# Patient Record
Sex: Female | Born: 1935 | Race: Black or African American | Hispanic: No | Smoking: Never smoker
Health system: Southern US, Community
[De-identification: ages and names within clinical notes are randomized; demographics above are authoritative.]

## PROBLEM LIST (undated history)

## (undated) DIAGNOSIS — I10 Essential (primary) hypertension: Secondary | ICD-10-CM

## (undated) DIAGNOSIS — F32A Depression, unspecified: Secondary | ICD-10-CM

## (undated) DIAGNOSIS — M1990 Unspecified osteoarthritis, unspecified site: Secondary | ICD-10-CM

## (undated) DIAGNOSIS — F329 Major depressive disorder, single episode, unspecified: Secondary | ICD-10-CM

## (undated) DIAGNOSIS — K5900 Constipation, unspecified: Secondary | ICD-10-CM

## (undated) DIAGNOSIS — C140 Malignant neoplasm of pharynx, unspecified: Secondary | ICD-10-CM

## (undated) HISTORY — PX: FRACTURE SURGERY: SHX138

## (undated) HISTORY — PX: CATARACT EXTRACTION W/ INTRAOCULAR LENS  IMPLANT, BILATERAL: SHX1307

---

## 1959-07-05 HISTORY — PX: TONSILLECTOMY: SUR1361

## 1959-07-05 HISTORY — PX: THROAT SURGERY: SHX803

## 2003-02-02 ENCOUNTER — Ambulatory Visit (HOSPITAL_COMMUNITY): Admission: RE | Admit: 2003-02-02 | Discharge: 2003-02-02 | Payer: Self-pay | Admitting: Gastroenterology

## 2003-02-03 ENCOUNTER — Encounter: Payer: Self-pay | Admitting: Gastroenterology

## 2003-02-03 ENCOUNTER — Ambulatory Visit (HOSPITAL_COMMUNITY): Admission: RE | Admit: 2003-02-03 | Discharge: 2003-02-03 | Payer: Self-pay | Admitting: Gastroenterology

## 2003-10-15 ENCOUNTER — Emergency Department (HOSPITAL_COMMUNITY): Admission: EM | Admit: 2003-10-15 | Discharge: 2003-10-15 | Payer: Self-pay | Admitting: *Deleted

## 2005-10-08 ENCOUNTER — Encounter: Admission: RE | Admit: 2005-10-08 | Discharge: 2005-10-08 | Payer: Self-pay | Admitting: Family Medicine

## 2005-10-14 ENCOUNTER — Encounter: Admission: RE | Admit: 2005-10-14 | Discharge: 2005-10-14 | Payer: Self-pay | Admitting: Family Medicine

## 2007-01-05 ENCOUNTER — Encounter: Admission: RE | Admit: 2007-01-05 | Discharge: 2007-01-05 | Payer: Self-pay | Admitting: Family Medicine

## 2007-02-02 ENCOUNTER — Encounter: Admission: RE | Admit: 2007-02-02 | Discharge: 2007-02-02 | Payer: Self-pay | Admitting: Family Medicine

## 2008-06-09 ENCOUNTER — Encounter: Admission: RE | Admit: 2008-06-09 | Discharge: 2008-06-09 | Payer: Self-pay | Admitting: Family Medicine

## 2010-11-24 ENCOUNTER — Encounter: Payer: Self-pay | Admitting: Family Medicine

## 2010-11-25 ENCOUNTER — Emergency Department (HOSPITAL_COMMUNITY)
Admission: EM | Admit: 2010-11-25 | Discharge: 2010-11-25 | Payer: Self-pay | Source: Home / Self Care | Admitting: Emergency Medicine

## 2010-11-26 LAB — BASIC METABOLIC PANEL
BUN: 15 mg/dL (ref 6–23)
CO2: 27 mEq/L (ref 19–32)
Calcium: 10.7 mg/dL — ABNORMAL HIGH (ref 8.4–10.5)
Chloride: 103 mEq/L (ref 96–112)
Creatinine, Ser: 0.85 mg/dL (ref 0.4–1.2)
GFR calc Af Amer: >60 mL/min (ref >60)
GFR calc non Af Amer: >60 mL/min (ref >60)
Glucose, Bld: 92 mg/dL (ref 70–99)
Potassium: 4 mEq/L (ref 3.5–5.1)
Sodium: 140 mEq/L (ref 135–145)

## 2010-11-26 LAB — DIFFERENTIAL
Basophils Absolute: 0 10*3/uL (ref 0.0–0.1)
Basophils Relative: 0 % (ref 0–1)
Eosinophils Absolute: 0.1 10*3/uL (ref 0.0–0.7)
Eosinophils Relative: 1 % (ref 0–5)
Lymphocytes Relative: 16 % (ref 12–46)
Lymphs Abs: 1.2 10*3/uL (ref 0.7–4.0)
Monocytes Absolute: 1.4 10*3/uL — ABNORMAL HIGH (ref 0.1–1.0)
Monocytes Relative: 19 % — ABNORMAL HIGH (ref 3–12)
Neutro Abs: 4.8 10*3/uL (ref 1.7–7.7)
Neutrophils Relative %: 64 % (ref 43–77)

## 2010-11-26 LAB — CBC
HCT: 32.1 % — ABNORMAL LOW (ref 36.0–46.0)
Hemoglobin: 10.2 g/dL — ABNORMAL LOW (ref 12.0–15.0)
MCH: 26.6 pg (ref 26.0–34.0)
MCHC: 31.8 g/dL (ref 30.0–36.0)
MCV: 83.6 fL (ref 78.0–100.0)
Platelets: 277 10*3/uL (ref 150–400)
RBC: 3.84 MIL/uL — ABNORMAL LOW (ref 3.87–5.11)
RDW: 13.9 % (ref 11.5–15.5)
WBC: 7.4 10*3/uL (ref 4.0–10.5)

## 2011-03-21 NOTE — Op Note (Signed)
   NAMEHARRIS, Jacqueline Huber NO.:  [id number]   MEDICAL RECORD NO.:  [id number]                   PATIENT TYPE:  OUT   LOCATION:  XRAY                                 FACILITY:  [facility]   PHYSICIAN:  Danise Edge, M.D.                DATE OF BIRTH:  May 15, 1921   DATE OF PROCEDURE:  DATE OF DISCHARGE:                                 OPERATIVE REPORT   PROCEDURE PERFORMED:  Incomplete colonoscopy.   REFERRING PHYSICIAN:  Donia Guiles, M.D.   ENDOSCOPIST:  Charolett Bumpers, M.D.   INDICATIONS FOR PROCEDURE:  The patient is a 77 year old female born May 25, 1921.  The patient was scheduled to undergo a complete colonoscopy to  evaluate blood in her stool.  Due to left colonic loop formation, I was only  able to advance the colonoscope to approximately the splenic flexure.   PREMEDICATION:  Demerol 50l mg, Versed 8 mg.   INSTRUMENT USED:  Pediatric Olympus colonoscope.   DESCRIPTION OF PROCEDURE:  After obtaining informed consent, the patient was  placed in the left lateral decubitus position.  I administered intravenous  Demerol and intravenous Versed to achieve conscious sedation for the  procedure.  The patient's blood pressure, oxygen saturations and cardiac  rhythm were monitored throughout the procedure and documented in the medical  record.   Anal inspection was normal.  Digital rectal exam was normal.  The pediatric  Olympus video colonoscope was introduced into the rectum and advanced to the  splenic flexure noted at approximately 60 cm from the anal verge.  Due to  colonic loop formation, a complete colonoscopy was not possible.   Endoscopic appearance of her rectum, sigmoid colon, descending colon and  splenic flexure was normal.  There was no sign of gastrointestinal bleeding  or colorectal neoplasia.    ASSESSMENT:  Normal proctocolonoscopy to the splenic flexure.  Due to left  colonic loop formation, I was unable to perform a  complete colonoscopy.   PLAN:  I will schedule the patient for an air contrast barium enema at [facility] February 03, 2003.                                                  Danise Edge, M.D.    MJ/MEDQ  D:  02/02/2003  T:  02/03/2003  Job:  [id number]   cc:   Donia Guiles, M.D.  [address] [place]  [place] [address]  Fax: [phone]

## 2011-09-22 ENCOUNTER — Emergency Department (HOSPITAL_COMMUNITY)
Admission: EM | Admit: 2011-09-22 | Discharge: 2011-09-22 | Disposition: A | Discharge status: Home / Self Care | Payer: Federal, State, Local not specified - PPO | Attending: Emergency Medicine | Admitting: Emergency Medicine

## 2011-09-22 DIAGNOSIS — I10 Essential (primary) hypertension: Secondary | ICD-10-CM

## 2011-09-22 DIAGNOSIS — Z79899 Other long term (current) drug therapy: Secondary | ICD-10-CM | POA: Insufficient documentation

## 2011-09-22 DIAGNOSIS — K5900 Constipation, unspecified: Secondary | ICD-10-CM | POA: Insufficient documentation

## 2011-09-22 DIAGNOSIS — K6289 Other specified diseases of anus and rectum: Secondary | ICD-10-CM | POA: Insufficient documentation

## 2011-09-22 DIAGNOSIS — K5641 Fecal impaction: Secondary | ICD-10-CM

## 2011-09-22 MED ORDER — AMLODIPINE BESYLATE 10 MG PO TABS: 10.0000 mg | ORAL_TABLET | ORAL | Status: DC

## 2011-09-22 MED ORDER — DOCUSATE SODIUM 100 MG PO CAPS: 100.0000 mg | ORAL_CAPSULE | Freq: Two times a day (BID) | ORAL | Status: AC

## 2011-09-22 MED ORDER — MOEXIPRIL-HYDROCHLOROTHIAZIDE 15-25 MG PO TABS: 1.0000 | ORAL_TABLET | Freq: Every day | ORAL | Status: DC

## 2011-09-22 MED ORDER — LISINOPRIL 10 MG PO TABS
10.0000 mg | ORAL_TABLET | ORAL | Status: AC
  Administered 2011-09-22: 10 mg via ORAL
  Filled 2011-09-22 (×2): qty 1

## 2011-09-22 MED ORDER — AMLODIPINE BESYLATE 10 MG PO TABS
10.0000 mg | ORAL_TABLET | ORAL | Status: AC
  Administered 2011-09-22: 10 mg via ORAL
  Filled 2011-09-22 (×2): qty 1

## 2011-09-22 MED ORDER — LIDOCAINE HCL 2 % EX GEL
Freq: Once | CUTANEOUS | Status: AC
  Administered 2011-09-22: 11:00:00
  Filled 2011-09-22: qty 20

## 2011-09-22 NOTE — Discharge Instructions (Signed)
Arterial Hypertension °Arterial hypertension (high blood pressure) is a condition of elevated pressure in your blood vessels. Hypertension over a long period of time is a risk factor for strokes, heart attacks, and heart failure. It is also the leading cause of kidney (renal) failure.  °CAUSES  °· In Adults -- Over 90% of all hypertension has no known cause. This is called essential or primary hypertension. In the other 10% of people with hypertension, the increase in blood pressure is caused by another disorder. This is called secondary hypertension. Important causes of secondary hypertension are:  °· Heavy alcohol use.  °· Obstructive sleep apnea.  °· Hyperaldosterosim (Conn's syndrome).  °· Steroid use.  °· Chronic kidney failure.  °· Hyperparathyroidism.  °· Medications.  °· Renal artery stenosis.  °· Pheochromocytoma.  °· Cushing's disease.  °· Coarctation of the aorta.  °· Scleroderma renal crisis.  °· Licorice (in excessive amounts).  °· Drugs (cocaine, methamphetamine).  °Your caregiver can explain any items above that apply to you. °· In Children -- Secondary hypertension is more common and should always be considered.  °· Pregnancy -- Few women of childbearing age have high blood pressure. However, up to 10% of them develop hypertension of pregnancy. Generally, this will not harm the woman. It may be a sign of 3 complications of pregnancy: preeclampsia, HELLP syndrome, and eclampsia. Follow up and control with medication is necessary.  °SYMPTOMS  °· This condition normally does not produce any noticeable symptoms. It is usually found during a routine exam.  °· Malignant hypertension is a late problem of high blood pressure. It may have the following symptoms:  °· Headaches.  °· Blurred vision.  °· End-organ damage (this means your kidneys, heart, lungs, and other organs are being damaged).  °· Stressful situations can increase the blood pressure. If a person with normal blood pressure has their blood  pressure go up while being seen by their caregiver, this is often termed "white coat hypertension." Its importance is not known. It may be related with eventually developing hypertension or complications of hypertension.  °· Hypertension is often confused with mental tension, stress, and anxiety.  °DIAGNOSIS  °The diagnosis is made by 3 separate blood pressure measurements. They are taken at least 1 week apart from each other. If there is organ damage from hypertension, the diagnosis may be made without repeat measurements. °Hypertension is usually identified by having blood pressure readings: °· Above 140/90 mmHg measured in both arms, at 3 separate times, over a couple weeks.  °· Over 130/80 mmHg should be considered a risk factor and may require treatment in patients with diabetes.  °Blood pressure readings over 120/80 mmHg are called "pre-hypertension" even in non-diabetic patients. °To get a true blood pressure measurement, use the following guidelines. Be aware of the factors that can alter blood pressure readings. °· Take measurements at least 1 hour after caffeine.  °· Take measurements 30 minutes after smoking and without any stress. This is another reason to quit smoking - it raises your blood pressure.  °· Use a proper cuff size. Ask your caregiver if you are not sure about your cuff size.  °· Most home blood pressure cuffs are automatic. They will measure systolic and diastolic pressures. The systolic pressure is the pressure reading at the start of sounds. Diastolic pressure is the pressure at which the sounds disappear. If you are elderly, measure pressures in multiple postures. Try sitting, lying or standing.  °· Sit at rest for a minimum of   5 minutes before taking measurements.  °· You should not be on any medications like decongestants. These are found in many cold medications.  °· Record your blood pressure readings and review them with your caregiver.  °If you have hypertension: °· Your caregiver  may do tests to be sure you do not have secondary hypertension (see "causes" above).  °· Your caregiver may also look for signs of metabolic syndrome. This is also called Syndrome X or Insulin Resistance Syndrome. You may have this syndrome if you have type 2 diabetes, abdominal obesity, and abnormal blood lipids in addition to hypertension.  °· Your caregiver will take your medical and family history and perform a physical exam.  °· Diagnostic tests may include blood tests (for glucose, cholesterol, potassium, and kidney function), a urinalysis, or an EKG. Other tests may also be necessary depending on your condition.  °PREVENTION  °There are important lifestyle issues that you can adopt to reduce your chance of developing hypertension: °· Maintain a normal weight.  °· Limit the amount of salt (sodium) in your diet.  °· Exercise often.  °· Limit alcohol intake.  °· Get enough potassium in your diet. Discuss specific advice with your caregiver.  °· Follow a DASH diet (dietary approaches to stop hypertension). This diet is rich in fruits, vegetables, and low-fat dairy products, and avoids certain fats.  °PROGNOSIS  °Essential hypertension cannot be cured. Lifestyle changes and medical treatment can lower blood pressure and reduce complications. The prognosis of secondary hypertension depends on the underlying cause. Many people whose hypertension is controlled with medicine or lifestyle changes can live a normal, healthy life.  °RISKS AND COMPLICATIONS  °While high blood pressure alone is not an illness, it often requires treatment due to its short- and long-term effects on many organs. Hypertension increases your risk for: °· CVAs or strokes (cerebrovascular accident).  °· Heart failure due to chronically high blood pressure (hypertensive cardiomyopathy).  °· Heart attack (myocardial infarction).  °· Damage to the retina (hypertensive retinopathy).  °· Kidney failure (hypertensive nephropathy).  °Your caregiver can  explain list items above that apply to you. Treatment of hypertension can significantly reduce the risk of complications. °TREATMENT  °· For overweight patients, weight loss and regular exercise are recommended. Physical fitness lowers blood pressure.  °· Mild hypertension is usually treated with diet and exercise. A diet rich in fruits and vegetables, fat-free dairy products, and foods low in fat and salt (sodium) can help lower blood pressure. Decreasing salt intake decreases blood pressure in a 1/3 of people.  °· Stop smoking if you are a smoker.  °The steps above are highly effective in reducing blood pressure. While these actions are easy to suggest, they are difficult to achieve. Most patients with moderate or severe hypertension end up requiring medications to bring their blood pressure down to a normal level. There are several classes of medications for treatment. Blood pressure pills (antihypertensives) will lower blood pressure by their different actions. Lowering the blood pressure by 10 mmHg may decrease the risk of complications by as much as 25%. °The goal of treatment is effective blood pressure control. This will reduce your risk for complications. Your caregiver will help you determine the best treatment for you according to your lifestyle. What is excellent treatment for one person, may not be for you. °HOME CARE INSTRUCTIONS  °· Do not smoke.  °· Follow the lifestyle changes outlined in the "Prevention" section.  °· If you are on medications, follow the directions   carefully. Blood pressure medications must be taken as prescribed. Skipping doses reduces their benefit. It also puts you at risk for problems.   Follow up with your caregiver, as directed.   If you are asked to monitor your blood pressure at home, follow the guidelines in the "Diagnosis" section above.  SEEK MEDICAL CARE IF:   You think you are having medication side effects.   You have recurrent headaches or lightheadedness.     You have swelling in your ankles.   You have trouble with your vision.  SEEK IMMEDIATE MEDICAL CARE IF:   You have sudden onset of chest pain or pressure, difficulty breathing, or other symptoms of a heart attack.   You have a severe headache.   You have symptoms of a stroke (such as sudden weakness, difficulty speaking, difficulty walking).  MAKE SURE YOU:   Understand these instructions.   Will watch your condition.   Will get help right away if you are not doing well or get worse.  Document Released: 10/20/2005 Document Revised: 07/02/2011 Document Reviewed: 05/20/2007 [facility] Patient Information 2012 [place], [place].Fecal Impaction A fecal impaction happens when there is a large, firm amount of stool (poop) that cannot be passed. The impacted stool is usually in the rectum, which is the lowest part of the large bowel. The impacted stool can block the colon and cause significant problems. CAUSES  The longer stool stays in the rectum, the harder it gets. Anything that slows down your bowel movements can lead to fecal impaction. These conditions include:  Constipation (having firm hard stools). This can be a longstanding (chronic) problem, or can happen suddenly (acutely).   Painful conditions of the rectum, such as hemorrhoids or anal fissures. The pain of these conditions can make you try to avoid having bowel movements.   Narcotic pain medications cause constipation, which can sometimes be severe. If you take narcotic pain medication, you should also talk with your caregiver about preventing constipation.   Not getting enough fluids.   Inactivity and bed rest over long periods of time.  SYMPTOMS  Some symptoms of fecal impaction include:  Lack of normal bowel movements or changes in bowel patterns.   Sense of fullness in the rectum, but unable to pass stool.   Pain or cramps in the stomach or abdominal area (often after meals).   Thin, watery discharge from the  rectum.  Without treatment, a fecal impaction can block the colon and cause severe abdominal pain or colon tears (perforation). This may lead to surgery.  DIAGNOSIS  Fecal impaction is suspected based upon your symptoms and upon a physical examination. This will include an exam of your rectum, which can confirm the diagnosis. Sometimes x-rays or lab testing may be needed to confirm the diagnosis and be sure there are no other problems.  TREATMENT   Initially an impaction can be removed manually. Your caregiver, using a gloved finger, can remove hard stool from your rectum.   Medication is sometimes needed. A suppository or enema can be given in the rectum to soften the stool. This can stimulate a bowel movement. Medicines can also be given by mouth (orally).   Surgery may be needed if the colon has torn (perforated) due to blockage. This is very rare.  HOME CARE INSTRUCTIONS   Develop regular bowel habits. This may be something such as getting in the habit of having a bowel movement after your morning cup of coffee or after eating. Be sure to allow yourself enough  time on the toilet.   Maintain a high fiber diet.   Drink plenty of fluids each day. This is especially true for the elderly and especially during cold weather when thirst may not be as strong. Try to take in at least eight, 8 ounce glasses of water daily unless instructed otherwise.   Exercise regularly.   If you begin to get constipated, increase the amount of fiber in your diet. Eat plenty of fruits, vegetables, whole wheat breads, bran, oatmeal and similar products.   Natural fiber laxatives such as Metamucil are also very helpful.   Speak with your caregiver if you suspect medications may be causing constipation.  SEEK MEDICAL CARE IF:   You have ongoing constipation or a hard time passing your stools.   You have ongoing rectal pain.   You require enemas or suppositories more than twice a week.  SEEK IMMEDIATE MEDICAL  CARE IF:   There are continued problems or you develop abdominal pain.   You develop rectal bleeding.   You develop black or tarry stools or feel lightheaded.  MAKE SURE YOU:   Understand these instructions.   Will watch your condition.   Will get help right away if you are not doing well or get worse.  Document Released: 07/12/2004 Document Revised: 07/02/2011 Document Reviewed: 06/29/2008 [facility] Patient Information 2012 [place], [place].

## 2011-09-22 NOTE — ED Provider Notes (Signed)
Complains of constipation for 2 days has not been taking MiraLAX as directed. Feels much improved after disimpaction in the emergency department. Patient also noncompliant with antihypertensives.  Doug Sou, MD 09/22/11 1227

## 2011-09-22 NOTE — ED Notes (Signed)
Patient reports not having a bm in the past 2 days. Pt has active bowel sounds. She states that she ran out of miralax and was without it for a couple of days and that is what caused her to become constipated. Pt is alert and oriented. Breath sounds are clear. Pt denies any n/v.

## 2011-09-22 NOTE — ED Provider Notes (Signed)
Medical screening examination/treatment/procedure(s) were conducted as a shared visit with non-physician practitioner(s) and myself.  I personally evaluated the patient during the encounter  Doug Sou, MD 09/22/11 [phone]

## 2011-09-22 NOTE — ED Provider Notes (Signed)
History     CSN: [id number] Arrival date & time: 09/22/2011 10:50 AM   First MD Initiated Contact with Patient 09/22/11 1053      Chief Complaint  Patient presents with  . Constipation    (Consider location/radiation/quality/duration/timing/severity/associated sxs/prior treatment) HPI Comments: Patient comes with with a 2 day history of constipation - she states she has been trying multiple times to have a bowel movement - states that she is able to strain and get out several small "balls" of stool - she denies nausea, vomiting, decrease in appetite, diarrhea - reports no history of abdominal pain.  Patient is a 77 y.o. female presenting with constipation. The history is provided by the patient. No language interpreter was used.  Constipation  The current episode started 2 days ago. The problem occurs occasionally. The problem has been unchanged. The pain is mild. The stool is described as hard. There was no prior successful therapy. Prior unsuccessful therapies include diet changes, stool softeners and laxatives. Associated symptoms include rectal pain. Pertinent negatives include no anorexia, no fever, no abdominal pain, no diarrhea, no hematemesis, no nausea, no vomiting, no hematuria, no chest pain, no headaches, no difficulty breathing and no rash. She has been behaving normally. She has been eating and drinking normally. Urine output has been normal. The last void occurred less than 6 hours ago. Her past medical history does not include abdominal surgery, inflammatory bowel disease, recent abdominal injury, recent antibiotic use or a recent illness. There were no sick contacts. She has received no recent medical care.    No past medical history on file.  No past surgical history on file.  No family history on file.  History  Substance Use Topics  . Smoking status: Not on file  . Smokeless tobacco: Not on file  . Alcohol Use: Not on file    OB History    Grav Para Term  Preterm Abortions TAB SAB Ect Mult Living                  Review of Systems  Constitutional: Negative for fever.  Cardiovascular: Negative for chest pain.  Gastrointestinal: Positive for constipation and rectal pain. Negative for nausea, vomiting, abdominal pain, diarrhea, anorexia and hematemesis.  Genitourinary: Negative for hematuria.  Skin: Negative for rash.  Neurological: Negative for headaches.  All other systems reviewed and are negative.    Allergies  Review of patient's allergies indicates no known allergies.  Home Medications   Current Outpatient Rx  Name Route Sig Dispense Refill  . AMLODIPINE BESYLATE 10 MG PO TABS Oral Take 10 mg by mouth daily.      Marland Kitchen MOEXIPRIL-HYDROCHLOROTHIAZIDE 15-25 MG PO TABS Oral Take 1 tablet by mouth daily.      Marland Kitchen POLYETHYLENE GLYCOL 3350 PO PACK Oral Take 17 g by mouth every 7 (seven) days.        BP 151/73  Pulse 70  Temp(Src) 98.4 F (36.9 C) (Oral)  Resp 15  SpO2 100%  Physical Exam  Nursing note and vitals reviewed. Constitutional: She is oriented to person, place, and time. She appears well-developed and well-nourished.       hypertensive  HENT:  Head: Normocephalic and atraumatic.  Right Ear: External ear normal.  Left Ear: External ear normal.  Mouth/Throat: Oropharynx is clear and moist.  Eyes: Conjunctivae are normal. Pupils are equal, round, and reactive to light.  Neck: Normal range of motion. Neck supple.  Cardiovascular: Normal rate, regular rhythm, normal heart sounds and intact  distal pulses.  Exam reveals no gallop and no friction rub.   No murmur heard. Pulmonary/Chest: Effort normal and breath sounds normal. No respiratory distress.  Abdominal: Soft. Bowel sounds are normal. She exhibits no distension and no mass. There is no tenderness. There is no rebound and no guarding.  Genitourinary: Guaiac negative stool.       Large amount of hard stool in the rectal vault.  Musculoskeletal: Normal range of motion.   Neurological: She is alert and oriented to person, place, and time.  Skin: Skin is warm and dry.  Psychiatric: She has a normal mood and affect. Her behavior is normal. Judgment and thought content normal.    ED Course  Fecal disimpaction Date/Time: 09/22/2011 12:19 PM Performed by: Marisue Humble, Calina Patrie C. Authorized by: Patrecia Pour Consent: Verbal consent obtained. Written consent not obtained. Risks and benefits: risks, benefits and alternatives were discussed Consent given by: patient Patient understanding: patient states understanding of the procedure being performed Patient consent: the patient's understanding of the procedure does not match consent given Procedure consent: procedure consent does not match procedure scheduled Relevant documents: relevant documents not present or verified Test results: test results not available Site marked: the operative site was not marked Imaging studies: imaging studies not available Patient identity confirmed: verbally with patient and arm band Time out: Immediately prior to procedure a "time out" was called to verify the correct patient, procedure, equipment, support staff and site/side marked as required. Preparation: Rolled onto left lateral side. Local anesthesia used: no Patient sedated: no Patient tolerance: Patient tolerated the procedure well with no immediate complications. Comments: Disimpacted the patient of large amount of stool - no bleeding noted - patient reports feeling much better after disimpaction.   (including critical care time)  Labs Reviewed - No data to display No results found.   Fecal Impaction    MDM  Patient reports feeling better after disimpaction - she is non-compliant on HTN medications and we have given her home medications here - will prescribe refills of both medications and she will follow up with Dr. Clelia Croft this coming week.        Izola Price [place], [place] 09/22/11 1244

## 2011-09-22 NOTE — ED Notes (Signed)
Pt, having constipation for 2 days., also hernia are painful

## 2012-05-08 ENCOUNTER — Encounter (HOSPITAL_COMMUNITY): Payer: Self-pay | Admitting: *Deleted

## 2012-05-08 ENCOUNTER — Emergency Department (HOSPITAL_COMMUNITY): Payer: Federal, State, Local not specified - PPO

## 2012-05-08 ENCOUNTER — Emergency Department (HOSPITAL_COMMUNITY)
Admission: EM | Admit: 2012-05-08 | Discharge: 2012-05-08 | Disposition: A | Discharge status: Home / Self Care | Payer: Federal, State, Local not specified - PPO | Attending: Emergency Medicine | Admitting: Emergency Medicine

## 2012-05-08 DIAGNOSIS — I10 Essential (primary) hypertension: Secondary | ICD-10-CM

## 2012-05-08 DIAGNOSIS — Z79899 Other long term (current) drug therapy: Secondary | ICD-10-CM | POA: Insufficient documentation

## 2012-05-08 DIAGNOSIS — K5900 Constipation, unspecified: Secondary | ICD-10-CM | POA: Insufficient documentation

## 2012-05-08 HISTORY — DX: Essential (primary) hypertension: I10

## 2012-05-08 MED ORDER — AMLODIPINE BESYLATE 10 MG PO TABS
10.0000 mg | ORAL_TABLET | Freq: Once | ORAL | Status: AC
  Administered 2012-05-08: 10 mg via ORAL
  Filled 2012-05-08 (×2): qty 1

## 2012-05-08 MED ORDER — MOEXIPRIL-HYDROCHLOROTHIAZIDE 15-25 MG PO TABS: 1.0000 | ORAL_TABLET | Freq: Every day | ORAL | Status: DC

## 2012-05-08 MED ORDER — AMLODIPINE BESYLATE 10 MG PO TABS: 10.0000 mg | ORAL_TABLET | Freq: Every day | ORAL | Status: DC

## 2012-05-08 NOTE — ED Notes (Signed)
Pt said feeling much better and ready to go home

## 2012-05-08 NOTE — ED Provider Notes (Signed)
History     CSN: [id number]  Arrival date & time 05/08/12  1137   First MD Initiated Contact with Patient 05/08/12 1528      Chief Complaint  Patient presents with  . Constipation    (Consider location/radiation/quality/duration/timing/severity/associated sxs/prior treatment) HPI Patient is an 78 year old female who presents today complaining of rectal pain with last bowel movement 5 days ago. Patient denies any abdominal pain, nausea, or vomiting. Patient denies any fevers or urinary symptoms. Patient also is very hypertensive and apparently has not been taking any of her medications including her antihypertensives and her prescribed MiraLAX. Family reports that they have had difficulty with getting her to take her medications and take care of herself since her husband died in 2022-12-16. Patient does not have any suicidal ideation. Patient denies any chest pain, shortness of breath, lightheadedness, or focal neurologic symptoms. She denies any blood in her stools. Patient does have a history of hemorrhoids reports that these are "out". Patient has not tried an enema or anything else at home for this. There no other associated modifying factors. Past Medical History  Diagnosis Date  . Hypertension     History reviewed. No pertinent past surgical history.  History reviewed. No pertinent family history.  History  Substance Use Topics  . Smoking status: Never Smoker   . Smokeless tobacco: Not on file  . Alcohol Use: No    OB History    Grav Para Term Preterm Abortions TAB SAB Ect Mult Living                  Review of Systems  Constitutional: Negative.   HENT: Negative.   Eyes: Negative.   Respiratory: Negative.   Cardiovascular: Negative.   Gastrointestinal: Positive for constipation and rectal pain.  Genitourinary: Negative.   Musculoskeletal: Negative.   Skin: Negative.   Neurological: Negative.   Hematological: Negative.   Psychiatric/Behavioral: Negative.   All  other systems reviewed and are negative.    Allergies  Review of patient's allergies indicates no known allergies.  Home Medications   Current Outpatient Rx  Name Route Sig Dispense Refill  . AMLODIPINE BESYLATE 10 MG PO TABS Oral Take 10 mg by mouth daily.      Marland Kitchen MOEXIPRIL-HYDROCHLOROTHIAZIDE 15-25 MG PO TABS Oral Take 1 tablet by mouth daily.      Marland Kitchen POLYETHYLENE GLYCOL 3350 PO PACK Oral Take 17 g by mouth daily.     Marland Kitchen AMLODIPINE BESYLATE 10 MG PO TABS Oral Take 1 tablet (10 mg total) by mouth daily. 30 tablet 0  . MOEXIPRIL-HYDROCHLOROTHIAZIDE 15-25 MG PO TABS Oral Take 1 tablet by mouth daily. 30 tablet 0    BP 158/113  Pulse 92  Temp 97.9 F (36.6 C) (Oral)  Resp 14  SpO2 100%  Physical Exam  Nursing note and vitals reviewed. GEN: Well-developed, thin, elderly female in no distress HEENT: Atraumatic, normocephalic.  EYES: PERRLA BL, no scleral icterus. NECK: Trachea midline, no meningismus CV: regular rate and rhythm. No murmurs, rubs, or gallops PULM: No respiratory distress.  No crackles, wheezes, or rales. GI: soft, non-tender. No guarding, rebound, or tenderness. + bowel sounds. On rectal exam patient does have external hemorrhoids as well as soft brown stool noted on her underwear and on her period rectal exam did demonstrate soft brown stool with some impaction. So this was removed digitally. There is no dark tarry stool or blood in stool concerning for GI bleeding.  GU: deferred Neuro: cranial nerves grossly 2-12 intact,  no abnormalities of strength or sensation, A and O x 3 MSK: Patient moves all 4 extremities symmetrically, no deformity, edema, or injury noted Skin: No rashes petechiae, purpura, or jaundice Psych: no abnormality of mood   ED Course  Procedures (including critical care time)  Labs Reviewed - No data to display Dg Abd 1 View  05/08/2012  *RADIOLOGY REPORT*  Clinical Data: Abdominal pain.  Constipation.  ABDOMEN - 1 VIEW  Comparison: CT abdomen  and pelvis 06/09/2008, 10/14/2005.  Findings: Bowel gas pattern unremarkable without evidence of obstruction or significant ileus.  Large amount of stool in the descending colon, sigmoid colon, and especially the rectum. Calcified uterine fibroids as noted previously.  No visible opaque urinary tract calculi.  Phleboliths in both sides of the pelvis. Generalized osteopenia and degenerative changes involving the lower lumbar spine and both hips.  IMPRESSION: Possible rectal fecal impaction.  No acute abdominal abnormality otherwise.  Original Report Authenticated By: Arnell Sieving, M.D.     1. Constipation   2. Hypertension       MDM  Patient was evaluated by myself. She has been noncompliant with her medications for blood pressure and constipation. Rectal exam demonstrated no concerning findings other than slight fecal impaction. Following some extraction of stool patient was able to have a bowel movement on the toilet chair and is feeling better. Patient denied any chest pain, shortness of breath, or lightheadedness. She has admittedly not been taking any of her blood pressure medications. Patient was given a dose of amlodipine here. Following release of her fecal impaction she was feeling better. Patient and family were told that the patient must take her home medications. She was given a prescription for both her blood pressure medications and told to followup with Dr. Clelia Croft. She was told also that she must take her MiraLAX and she can take up to 3 times a day. Patient was told that she should use an enema or suppository for her symptoms persist for more than 2 days. Patient was discharged in good condition.        Cyndra Numbers, MD 05/08/12 2007

## 2012-05-08 NOTE — ED Notes (Signed)
Pt on bedside commode attempting BM  

## 2012-05-08 NOTE — ED Notes (Signed)
Pt reports no bowel movement in a week. Reports rectal pain from straining. Did not take her blood pressure medication this am.

## 2012-05-08 NOTE — ED Notes (Addendum)
Reports has problems with constipation before. Stopped taking miralax awhile ago, supposed to take daily. Pt non compliant with all meds. Denies abd pain, n/v but does report decreased appetite. C/o rectal pain & hemorrhoids

## 2012-06-17 ENCOUNTER — Emergency Department (HOSPITAL_COMMUNITY): Payer: Federal, State, Local not specified - PPO

## 2012-06-17 ENCOUNTER — Encounter (HOSPITAL_COMMUNITY): Payer: Self-pay | Admitting: Emergency Medicine

## 2012-06-17 ENCOUNTER — Emergency Department (HOSPITAL_COMMUNITY)
Admission: EM | Admit: 2012-06-17 | Discharge: 2012-06-18 | Disposition: A | Discharge status: Home / Self Care | Payer: Federal, State, Local not specified - PPO | Attending: Emergency Medicine | Admitting: Emergency Medicine

## 2012-06-17 DIAGNOSIS — R002 Palpitations: Secondary | ICD-10-CM | POA: Insufficient documentation

## 2012-06-17 DIAGNOSIS — E876 Hypokalemia: Secondary | ICD-10-CM | POA: Insufficient documentation

## 2012-06-17 DIAGNOSIS — I498 Other specified cardiac arrhythmias: Secondary | ICD-10-CM | POA: Insufficient documentation

## 2012-06-17 DIAGNOSIS — I471 Supraventricular tachycardia: Secondary | ICD-10-CM

## 2012-06-17 DIAGNOSIS — Z79899 Other long term (current) drug therapy: Secondary | ICD-10-CM | POA: Insufficient documentation

## 2012-06-17 DIAGNOSIS — I10 Essential (primary) hypertension: Secondary | ICD-10-CM | POA: Insufficient documentation

## 2012-06-17 MED ORDER — ADENOSINE 6 MG/2ML IV SOLN: 6.0000 mg | Freq: Once | INTRAVENOUS | Status: DC

## 2012-06-17 MED ORDER — ADENOSINE 6 MG/2ML IV SOLN
INTRAVENOUS | Status: AC
  Filled 2012-06-17: qty 6

## 2012-06-17 MED ORDER — SODIUM CHLORIDE 0.9 % IV SOLN
INTRAVENOUS | Status: DC
  Administered 2012-06-17: via INTRAVENOUS

## 2012-06-17 MED ORDER — DILTIAZEM HCL 100 MG IV SOLR
5.0000 mg/h | Freq: Once | INTRAVENOUS | Status: DC
  Filled 2012-06-17: qty 100

## 2012-06-17 MED ORDER — DILTIAZEM HCL 25 MG/5ML IV SOLN
10.0000 mg | Freq: Once | INTRAVENOUS | Status: AC
  Administered 2012-06-17: 10 mg via INTRAVENOUS
  Filled 2012-06-17: qty 5

## 2012-06-17 NOTE — ED Notes (Signed)
Patient tachycardia since early this evening.  Patient just not feeling well.  She denies any shortness of breath or chest pain.  Patient has missed her HTN for last two days.  Patient is CAOx3.

## 2012-06-17 NOTE — ED Provider Notes (Signed)
History     CSN: [id number]  Arrival date & time 06/17/12  2328   First MD Initiated Contact with Patient 06/17/12 2345      Chief Complaint  Patient presents with  . Tachycardia    (Consider location/radiation/quality/duration/timing/severity/associated sxs/prior treatment) The history is provided by the patient and a relative.  pt c/o palpitations onset earlier today. Constant. Feels rapid. Denies hx same. No hx cad or dysrhythmia. Denies faintness or lightheadedness. No sob. No nv no diaphoresis. States recent health at baseline w exception that since spouse died earlier this year, several months ago, pt states trouble adjusting. Occasional feelings of depression. Decreased appetite, unspecified wt loss. States hasnt taken her meds in past few days. Denies cp. No unusual dyspnea or fatigue. No headache. No cough or uri c/o. No abd pain. No nvd. No gu c/o. No fever or chills. Denies heat intolerance or sweats.    Past Medical History  Diagnosis Date  . Hypertension     History reviewed. No pertinent past surgical history.  No family history on file.  History  Substance Use Topics  . Smoking status: Never Smoker   . Smokeless tobacco: Not on file  . Alcohol Use: No    OB History    Grav Para Term Preterm Abortions TAB SAB Ect Mult Living                  Review of Systems  Constitutional: Negative for fever and chills.  HENT: Negative for neck pain and neck stiffness.   Eyes: Negative for pain and visual disturbance.  Respiratory: Negative for cough and shortness of breath.   Cardiovascular: Positive for palpitations. Negative for chest pain and leg swelling.  Gastrointestinal: Negative for abdominal pain.  Genitourinary: Negative for dysuria and flank pain.  Musculoskeletal: Negative for back pain.  Skin: Negative for rash.  Neurological: Negative for syncope, light-headedness and headaches.  Hematological: Does not bruise/bleed easily.    Allergies  Review  of patient's allergies indicates no known allergies.  Home Medications   Current Outpatient Rx  Name Route Sig Dispense Refill  . AMLODIPINE BESYLATE 10 MG PO TABS Oral Take 10 mg by mouth daily.      Marland Kitchen AMLODIPINE BESYLATE 10 MG PO TABS Oral Take 1 tablet (10 mg total) by mouth daily. 30 tablet 0  . MOEXIPRIL-HYDROCHLOROTHIAZIDE 15-25 MG PO TABS Oral Take 1 tablet by mouth daily.      Marland Kitchen MOEXIPRIL-HYDROCHLOROTHIAZIDE 15-25 MG PO TABS Oral Take 1 tablet by mouth daily. 30 tablet 0  . POLYETHYLENE GLYCOL 3350 PO PACK Oral Take 17 g by mouth daily.       BP 154/86  Pulse 162  Temp 98.1 F (36.7 C) (Oral)  Resp 21  SpO2 100%  Physical Exam  Nursing note and vitals reviewed. Constitutional: She is oriented to person, place, and time. She appears well-developed and well-nourished. No distress.  HENT:  Head: Atraumatic.  Nose: Nose normal.  Mouth/Throat: Oropharynx is clear and moist.  Eyes: Conjunctivae are normal. No scleral icterus.  Neck: Neck supple. No tracheal deviation present. No thyromegaly present.  Cardiovascular: Regular rhythm, normal heart sounds and intact distal pulses.   Pulmonary/Chest: Effort normal and breath sounds normal. No respiratory distress.  Abdominal: Soft. Normal appearance and bowel sounds are normal. She exhibits no distension and no mass. There is no tenderness. There is no rebound and no guarding.  Genitourinary:       No cva tenderness  Musculoskeletal: She exhibits no  edema and no tenderness.  Neurological: She is alert and oriented to person, place, and time.  Skin: Skin is warm and dry. No rash noted.  Psychiatric: She has a normal mood and affect.    ED Course  Procedures (including critical care time)   Labs Reviewed  COMPREHENSIVE METABOLIC PANEL  CBC  TROPONIN I  URINALYSIS, ROUTINE W REFLEX MICROSCOPIC  PROTIME-INR   Results for orders placed during the hospital encounter of 06/17/12  COMPREHENSIVE METABOLIC PANEL      Component  Value Range   Sodium 143  135 - 145 mEq/L   Potassium 2.9 (*) 3.5 - 5.1 mEq/L   Chloride 102  96 - 112 mEq/L   CO2 26  19 - 32 mEq/L   Glucose, Bld 175 (*) 70 - 99 mg/dL   BUN 12  6 - 23 mg/dL   Creatinine, Ser 2.95  0.50 - 1.10 mg/dL   Calcium 62.1 (*) 8.4 - 10.5 mg/dL   Total Protein 8.4 (*) 6.0 - 8.3 g/dL   Albumin 4.0  3.5 - 5.2 g/dL   AST 17  0 - 37 U/L   ALT 7  0 - 35 U/L   Alkaline Phosphatase 124 (*) 39 - 117 U/L   Total Bilirubin 0.5  0.3 - 1.2 mg/dL   GFR calc non Af Amer 62 (*) >90 mL/min   GFR calc Af Amer 72 (*) >90 mL/min  CBC      Component Value Range   WBC 5.9  4.0 - 10.5 K/uL   RBC 4.47  3.87 - 5.11 MIL/uL   Hemoglobin 12.9  12.0 - 15.0 g/dL   HCT 30.8  65.7 - 84.6 %   MCV 83.0  78.0 - 100.0 fL   MCH 28.9  26.0 - 34.0 pg   MCHC 34.8  30.0 - 36.0 g/dL   RDW 96.2  95.2 - 84.1 %   Platelets 239  150 - 400 K/uL  TROPONIN I      Component Value Range   Troponin I <0.30  <0.30 ng/mL  PROTIME-INR      Component Value Range   Prothrombin Time 12.8  11.6 - 15.2 seconds   INR 0.94  0.00 - 1.49   Dg Chest Port 1 View  06/18/2012  *RADIOLOGY REPORT*  Clinical Data: Tachycardia  PORTABLE CHEST - 1 VIEW  Comparison: 01/05/2007  Findings: Heart size upper limits normal.  Tortuous calcified aorta.  Lungs are clear.  No effusion.  Regional bones unremarkable.  IMPRESSION:  1.  No acute disease  Original Report Authenticated By: Osa Craver, M.D.      MDM  Iv ns.   Initial hr staying consistently at 150-155.  ?afib/flutter. cardizem iv - no immediate change in rhythm.  On recheck, pt converted to sinus rhythm, on initial repeat ecg w pvcs, occ pacs.  On subsequent checks of monitor/rhythm x several, pt in nsr.   Reviewed nursing notes and prior charts for additional history.     Date: 06/18/2012  Rate: 159  Rhythm: supraventricular tachycardia (SVT)  QRS Axis: left  Intervals: svt  ST/T Wave abnormalities: nonspecific ST/T changes  Conduction  Disutrbances:rbbbb, lafb  Narrative Interpretation:   Old EKG Reviewed: none available   After symptoms present > 12 hours, trop neg, no cp or discomfort of any sort.  kcl low, 2.9, kcl po. Discussed diet, oral k supplement.  On recheck, pt denies any c/o. No current or recent chest discomfort. No sob.  Discussed  dispo options including admit for additional monitoring, vs d/c home.  Pt states she is asymptomatic, feels at baseline, and requests d/c home.   Discussed close card f/u, pts family member requests referral to Dr Mayford Knife.   pts family in agreement w her as regards d/c plan. Remains in nsr and asymptomatic.         Suzi Roots, MD 06/18/12 [phone]

## 2012-06-18 LAB — URINALYSIS, ROUTINE W REFLEX MICROSCOPIC
Bilirubin Urine: NEGATIVE
Glucose, UA: NEGATIVE mg/dL
Hgb urine dipstick: NEGATIVE
Protein, ur: 30 mg/dL — AB
Urobilinogen, UA: 0.2 mg/dL (ref 0.0–1.0)

## 2012-06-18 LAB — COMPREHENSIVE METABOLIC PANEL
ALT: 7 U/L (ref 0–35)
AST: 17 U/L (ref 0–37)
Albumin: 4 g/dL (ref 3.5–5.2)
Alkaline Phosphatase: 124 U/L — ABNORMAL HIGH (ref 39–117)
BUN: 12 mg/dL (ref 6–23)
Chloride: 102 mEq/L (ref 96–112)
Potassium: 2.9 mEq/L — ABNORMAL LOW (ref 3.5–5.1)
Sodium: 143 mEq/L (ref 135–145)
Total Bilirubin: 0.5 mg/dL (ref 0.3–1.2)
Total Protein: 8.4 g/dL — ABNORMAL HIGH (ref 6.0–8.3)

## 2012-06-18 LAB — CBC
HCT: 37.1 % (ref 36.0–46.0)
MCH: 28.9 pg (ref 26.0–34.0)
MCHC: 34.8 g/dL (ref 30.0–36.0)
MCV: 83 fL (ref 78.0–100.0)
Platelets: 239 10*3/uL (ref 150–400)
RDW: 13.3 % (ref 11.5–15.5)
WBC: 5.9 10*3/uL (ref 4.0–10.5)

## 2012-06-18 LAB — URINE MICROSCOPIC-ADD ON

## 2012-06-18 LAB — PROTIME-INR: Prothrombin Time: 12.8 seconds (ref 11.6–15.2)

## 2012-06-18 MED ORDER — POTASSIUM CHLORIDE CRYS ER 20 MEQ PO TBCR: EXTENDED_RELEASE_TABLET | ORAL | Status: DC

## 2012-06-18 MED ORDER — POTASSIUM CHLORIDE CRYS ER 20 MEQ PO TBCR
40.0000 meq | EXTENDED_RELEASE_TABLET | Freq: Once | ORAL | Status: AC
  Administered 2012-06-18: 40 meq via ORAL
  Filled 2012-06-18: qty 2

## 2012-06-18 NOTE — ED Notes (Signed)
Patient converted to atrial fibrillation at this time.  Dr Denton Lank notified.

## 2013-02-09 ENCOUNTER — Encounter (HOSPITAL_COMMUNITY): Payer: Self-pay | Admitting: Family Medicine

## 2013-02-09 ENCOUNTER — Emergency Department (HOSPITAL_COMMUNITY)
Admission: EM | Admit: 2013-02-09 | Discharge: 2013-02-09 | Disposition: A | Discharge status: Home / Self Care | Payer: Federal, State, Local not specified - PPO | Attending: Emergency Medicine | Admitting: Emergency Medicine

## 2013-02-09 DIAGNOSIS — K6289 Other specified diseases of anus and rectum: Secondary | ICD-10-CM | POA: Insufficient documentation

## 2013-02-09 DIAGNOSIS — I10 Essential (primary) hypertension: Secondary | ICD-10-CM | POA: Insufficient documentation

## 2013-02-09 DIAGNOSIS — Z79899 Other long term (current) drug therapy: Secondary | ICD-10-CM | POA: Insufficient documentation

## 2013-02-09 DIAGNOSIS — K644 Residual hemorrhoidal skin tags: Secondary | ICD-10-CM | POA: Insufficient documentation

## 2013-02-09 DIAGNOSIS — K5900 Constipation, unspecified: Secondary | ICD-10-CM

## 2013-02-09 MED ORDER — FLEET ENEMA 7-19 GM/118ML RE ENEM
1.0000 | ENEMA | Freq: Once | RECTAL | Status: AC
  Administered 2013-02-09: 1 via RECTAL
  Filled 2013-02-09: qty 1

## 2013-02-09 NOTE — ED Notes (Signed)
Per pt sts 3 days without BM and took OTC mira lax without relief. sts pain in rectum and hemmrhoids are inflamed.

## 2013-02-09 NOTE — ED Notes (Signed)
Fleet enema given in bathroom with mod. Amount of results.  Pt st's she feels much better.

## 2013-02-09 NOTE — ED Provider Notes (Signed)
History     CSN: [id number]  Arrival date & time 02/09/13  1345   First MD Initiated Contact with Patient 02/09/13 1523      Chief Complaint  Patient presents with  . Constipation    (Consider location/radiation/quality/duration/timing/severity/associated sxs/prior treatment) Patient is a 79 y.o. female presenting with constipation.  Constipation  The current episode started 3 to 5 days ago. The onset was gradual. The problem occurs continuously. The problem has been gradually worsening. The pain is moderate. Stool description: none. Prior unsuccessful therapies include laxatives ("I haven't been taking it like I should"). Associated symptoms include rectal pain. Pertinent negatives include no fever, no abdominal pain, no diarrhea, no nausea, no vomiting, no chest pain and no coughing. Past medical history comments: hx of constipation.    Past Medical History  Diagnosis Date  . Hypertension     History reviewed. No pertinent past surgical history.  History reviewed. No pertinent family history.  History  Substance Use Topics  . Smoking status: Never Smoker   . Smokeless tobacco: Not on file  . Alcohol Use: No    OB History   Grav Para Term Preterm Abortions TAB SAB Ect Mult Living                  Review of Systems  Constitutional: Negative for fever.  HENT: Negative for congestion.   Respiratory: Negative for cough and shortness of breath.   Cardiovascular: Negative for chest pain.  Gastrointestinal: Positive for constipation and rectal pain. Negative for nausea, vomiting, abdominal pain and diarrhea.  Genitourinary: Negative for difficulty urinating.  All other systems reviewed and are negative.    Allergies  Review of patient's allergies indicates no known allergies.  Home Medications   Current Outpatient Rx  Name  Route  Sig  Dispense  Refill  . amLODipine (NORVASC) 10 MG tablet   Oral   Take 10 mg by mouth daily.           . polyethylene glycol  (MIRALAX / GLYCOLAX) packet   Oral   Take 17 g by mouth daily.            BP 131/68  Pulse 90  Temp(Src) 97.3 F (36.3 C)  Resp 18  SpO2 100%  Physical Exam  Nursing note and vitals reviewed. Constitutional: She is oriented to person, place, and time. She appears well-developed and well-nourished. No distress.  HENT:  Head: Normocephalic and atraumatic.  Mouth/Throat: Oropharynx is clear and moist.  Eyes: Conjunctivae are normal. Pupils are equal, round, and reactive to light. No scleral icterus.  Neck: Neck supple.  Cardiovascular: Normal rate, regular rhythm, normal heart sounds and intact distal pulses.   No murmur heard. Pulmonary/Chest: Effort normal and breath sounds normal. No stridor. No respiratory distress. She has no rales.  Abdominal: Soft. Bowel sounds are normal. She exhibits no distension. There is no tenderness.  Genitourinary: Rectal exam shows external hemorrhoid (small, nonthrombosed). Rectal exam shows anal tone normal.  Large amount of soft stool in rectal vault.  No hard, impacted stool.  Musculoskeletal: Normal range of motion.  Neurological: She is alert and oriented to person, place, and time.  Skin: Skin is warm and dry. No rash noted.  Psychiatric: She has a normal mood and affect. Her behavior is normal.    ED Course  Procedures (including critical care time)  Labs Reviewed - No data to display No results found.   1. Constipation       MDM  79 yo female with constipation.  No abdominal pain and no abdominal tenderness.  Soft stool in rectal vault, will try enema.    Enema with moderate success.  Dc'd home with instructions to use her miralax.       Rennis Petty, MD 02/09/13 [phone]

## 2013-02-09 NOTE — ED Provider Notes (Signed)
Pt complains of constipation.  No vomiting.  No abdominal pain.  Alert, nad.  VSS  Will attempt enema here in the ED for symptomatic relief.    Discussed findings with patient and family.  I saw and evaluated the patient, reviewed the resident's note and I agree with the findings and plan.   Celene Kras, MD 02/09/13 [phone]

## 2013-02-09 NOTE — ED Notes (Signed)
Pt family st's pt's last BM was 1 week ago has been taking Murelax but not everyday.

## 2014-03-02 ENCOUNTER — Encounter (HOSPITAL_COMMUNITY): Payer: Self-pay | Admitting: Emergency Medicine

## 2014-03-02 ENCOUNTER — Emergency Department (HOSPITAL_COMMUNITY): Payer: Medicare Other

## 2014-03-02 ENCOUNTER — Inpatient Hospital Stay (HOSPITAL_COMMUNITY)
Admission: EM | Admit: 2014-03-02 | Discharge: 2014-03-06 | DRG: 480 | Disposition: A | Discharge status: Skilled Nursing Facility | Payer: Medicare Other | Attending: Internal Medicine | Admitting: Internal Medicine

## 2014-03-02 DIAGNOSIS — Z85819 Personal history of malignant neoplasm of unspecified site of lip, oral cavity, and pharynx: Secondary | ICD-10-CM

## 2014-03-02 DIAGNOSIS — F329 Major depressive disorder, single episode, unspecified: Secondary | ICD-10-CM | POA: Diagnosis present

## 2014-03-02 DIAGNOSIS — Z8249 Family history of ischemic heart disease and other diseases of the circulatory system: Secondary | ICD-10-CM

## 2014-03-02 DIAGNOSIS — D649 Anemia, unspecified: Secondary | ICD-10-CM

## 2014-03-02 DIAGNOSIS — Y92009 Unspecified place in unspecified non-institutional (private) residence as the place of occurrence of the external cause: Secondary | ICD-10-CM

## 2014-03-02 DIAGNOSIS — I10 Essential (primary) hypertension: Secondary | ICD-10-CM | POA: Diagnosis present

## 2014-03-02 DIAGNOSIS — IMO0002 Reserved for concepts with insufficient information to code with codable children: Secondary | ICD-10-CM

## 2014-03-02 DIAGNOSIS — W010XXA Fall on same level from slipping, tripping and stumbling without subsequent striking against object, initial encounter: Secondary | ICD-10-CM | POA: Diagnosis present

## 2014-03-02 DIAGNOSIS — Z79899 Other long term (current) drug therapy: Secondary | ICD-10-CM

## 2014-03-02 DIAGNOSIS — E860 Dehydration: Secondary | ICD-10-CM | POA: Diagnosis present

## 2014-03-02 DIAGNOSIS — F3289 Other specified depressive episodes: Secondary | ICD-10-CM | POA: Diagnosis present

## 2014-03-02 DIAGNOSIS — E43 Unspecified severe protein-calorie malnutrition: Secondary | ICD-10-CM | POA: Diagnosis present

## 2014-03-02 DIAGNOSIS — S72002A Fracture of unspecified part of neck of left femur, initial encounter for closed fracture: Secondary | ICD-10-CM | POA: Diagnosis present

## 2014-03-02 DIAGNOSIS — D62 Acute posthemorrhagic anemia: Secondary | ICD-10-CM | POA: Diagnosis not present

## 2014-03-02 DIAGNOSIS — S72009A Fracture of unspecified part of neck of unspecified femur, initial encounter for closed fracture: Secondary | ICD-10-CM | POA: Diagnosis present

## 2014-03-02 DIAGNOSIS — S72143A Displaced intertrochanteric fracture of unspecified femur, initial encounter for closed fracture: Principal | ICD-10-CM | POA: Diagnosis present

## 2014-03-02 DIAGNOSIS — D638 Anemia in other chronic diseases classified elsewhere: Secondary | ICD-10-CM | POA: Diagnosis present

## 2014-03-02 LAB — URINALYSIS, ROUTINE W REFLEX MICROSCOPIC
BILIRUBIN URINE: NEGATIVE
GLUCOSE, UA: NEGATIVE mg/dL
HGB URINE DIPSTICK: NEGATIVE
KETONES UR: NEGATIVE mg/dL
Leukocytes, UA: NEGATIVE
Nitrite: NEGATIVE
PROTEIN: NEGATIVE mg/dL
Specific Gravity, Urine: 1.013 (ref 1.005–1.030)
Urobilinogen, UA: 1 mg/dL (ref 0.0–1.0)
pH: 7.5 (ref 5.0–8.0)

## 2014-03-02 LAB — CBC WITH DIFFERENTIAL/PLATELET
BASOS ABS: 0 10*3/uL (ref 0.0–0.1)
Basophils Relative: 0 % (ref 0–1)
EOS PCT: 0 % (ref 0–5)
Eosinophils Absolute: 0 10*3/uL (ref 0.0–0.7)
HEMATOCRIT: 30.1 % — AB (ref 36.0–46.0)
HEMOGLOBIN: 10.4 g/dL — AB (ref 12.0–15.0)
LYMPHS PCT: 5 % — AB (ref 12–46)
Lymphs Abs: 0.5 10*3/uL — ABNORMAL LOW (ref 0.7–4.0)
MCH: 28.7 pg (ref 26.0–34.0)
MCHC: 34.6 g/dL (ref 30.0–36.0)
MCV: 83.1 fL (ref 78.0–100.0)
MONO ABS: 0.6 10*3/uL (ref 0.1–1.0)
MONOS PCT: 6 % (ref 3–12)
Neutro Abs: 8.5 10*3/uL — ABNORMAL HIGH (ref 1.7–7.7)
Neutrophils Relative %: 89 % — ABNORMAL HIGH (ref 43–77)
Platelets: 293 10*3/uL (ref 150–400)
RBC: 3.62 MIL/uL — ABNORMAL LOW (ref 3.87–5.11)
RDW: 13.1 % (ref 11.5–15.5)
WBC: 9.5 10*3/uL (ref 4.0–10.5)

## 2014-03-02 LAB — BASIC METABOLIC PANEL
BUN: 17 mg/dL (ref 6–23)
CALCIUM: 11.4 mg/dL — AB (ref 8.4–10.5)
CO2: 24 meq/L (ref 19–32)
CREATININE: 0.83 mg/dL (ref 0.50–1.10)
Chloride: 96 mEq/L (ref 96–112)
GFR calc Af Amer: 70 mL/min — ABNORMAL LOW (ref >90)
GFR calc non Af Amer: 60 mL/min — ABNORMAL LOW (ref >90)
GLUCOSE: 150 mg/dL — AB (ref 70–99)
Potassium: 3.4 mEq/L — ABNORMAL LOW (ref 3.7–5.3)
Sodium: 138 mEq/L (ref 137–147)

## 2014-03-02 MED ORDER — HYDROMORPHONE HCL PF 1 MG/ML IJ SOLN
0.5000 mg | INTRAMUSCULAR | Status: DC | PRN
  Administered 2014-03-03: 0.5 mg via INTRAVENOUS
  Filled 2014-03-02: qty 1

## 2014-03-02 MED ORDER — ACETAMINOPHEN 325 MG PO TABS
650.0000 mg | ORAL_TABLET | Freq: Four times a day (QID) | ORAL | Status: DC | PRN
  Administered 2014-03-03 – 2014-03-06 (×6): 650 mg via ORAL
  Filled 2014-03-02 (×7): qty 2

## 2014-03-02 MED ORDER — ACETAMINOPHEN 650 MG RE SUPP
650.0000 mg | Freq: Four times a day (QID) | RECTAL | Status: DC | PRN
  Filled 2014-03-02: qty 1

## 2014-03-02 MED ORDER — FENTANYL CITRATE 0.05 MG/ML IJ SOLN
50.0000 ug | INTRAMUSCULAR | Status: DC | PRN
  Administered 2014-03-02: 50 ug via INTRAVENOUS
  Filled 2014-03-02: qty 2

## 2014-03-02 MED ORDER — OXYCODONE HCL 5 MG PO TABS: 5.0000 mg | ORAL_TABLET | ORAL | Status: DC | PRN

## 2014-03-02 MED ORDER — CEFAZOLIN SODIUM 1-5 GM-% IV SOLN: 1.0000 g | INTRAVENOUS | Status: DC

## 2014-03-02 MED ORDER — SODIUM CHLORIDE 0.9 % IV SOLN
INTRAVENOUS | Status: DC
  Administered 2014-03-02 – 2014-03-03 (×2): via INTRAVENOUS

## 2014-03-02 MED ORDER — ONDANSETRON HCL 4 MG/2ML IJ SOLN: 4.0000 mg | Freq: Four times a day (QID) | INTRAMUSCULAR | Status: DC | PRN

## 2014-03-02 MED ORDER — ONDANSETRON HCL 4 MG/2ML IJ SOLN: 4.0000 mg | Freq: Three times a day (TID) | INTRAMUSCULAR | Status: AC | PRN

## 2014-03-02 MED ORDER — ONDANSETRON HCL 4 MG PO TABS: 4.0000 mg | ORAL_TABLET | Freq: Four times a day (QID) | ORAL | Status: DC | PRN

## 2014-03-02 MED ORDER — ALUM & MAG HYDROXIDE-SIMETH 200-200-20 MG/5ML PO SUSP: 30.0000 mL | Freq: Four times a day (QID) | ORAL | Status: DC | PRN

## 2014-03-02 MED ORDER — POTASSIUM CHLORIDE 20 MEQ/15ML (10%) PO LIQD
40.0000 meq | Freq: Once | ORAL | Status: AC
  Administered 2014-03-02: 40 meq via ORAL
  Filled 2014-03-02: qty 30

## 2014-03-02 MED ORDER — HYDROCODONE-ACETAMINOPHEN 5-325 MG PO TABS: 1.0000 | ORAL_TABLET | ORAL | Status: DC | PRN

## 2014-03-02 NOTE — ED Notes (Signed)
Bed: TA56 Expected date:  Expected time:  Means of arrival:  Comments: EMS- 80yr old, fall, hip injury

## 2014-03-02 NOTE — ED Provider Notes (Addendum)
CSN: [id number]     Arrival date & time 03/02/14  1724 History   First MD Initiated Contact with Patient 03/02/14 1733     Chief Complaint  Patient presents with  . Fall  . Hip Injury     (Consider location/radiation/quality/duration/timing/severity/associated sxs/prior Treatment) HPI  This a 80 year old female with history significant for hypertension who presents following a fall. Patient reports that she was walking across the carpet when she slipped and fell onto her left side. She reports pain in her left hip. She denies hitting her head or loss of consciousness. Currently she's not any pain but states she has increased pain with movement.  She denies any other pain at this time.   Past Medical History  Diagnosis Date  . Hypertension   . Cancer 1960s    throat cancer   History reviewed. No pertinent past surgical history. History reviewed. No pertinent family history. History  Substance Use Topics  . Smoking status: Never Smoker   . Smokeless tobacco: Not on file  . Alcohol Use: No   OB History   Grav Para Term Preterm Abortions TAB SAB Ect Mult Living                 Review of Systems  Respiratory: Negative for chest tightness and shortness of breath.   Gastrointestinal: Negative for abdominal pain.  Genitourinary: Negative for dysuria.  Musculoskeletal:       Left hip pain  Neurological: Negative for dizziness, light-headedness and headaches.  All other systems reviewed and are negative.     Allergies  Review of patient's allergies indicates no known allergies.  Home Medications   Prior to Admission medications   Medication Sig Start Date End Date Taking? Authorizing Provider  amLODipine (NORVASC) 10 MG tablet Take 10 mg by mouth daily.     Yes Historical Provider, MD  moexipril-hydrochlorothiazide (UNIRETIC) 15-25 MG per tablet Take 1 tablet by mouth daily.  01/20/14  Yes Historical Provider, MD  polyethylene glycol (MIRALAX / GLYCOLAX) packet Take 17 g  by mouth daily.    Yes Historical Provider, MD   BP 152/64  Pulse 101  Temp(Src) 97.9 F (36.6 C) (Oral)  Resp 16  SpO2 100% Physical Exam  Nursing note and vitals reviewed. Constitutional: She is oriented to person, place, and time.  Elderly, very thin and cachectic  HENT:  Head: Normocephalic and atraumatic.  Mucous membranes dry  Neck:  No midline C-spine tenderness  Cardiovascular: Normal rate, regular rhythm and normal heart sounds.   No murmur heard. Pulmonary/Chest: Effort normal and breath sounds normal. No respiratory distress. She has no wheezes.  Abdominal: Soft. There is no tenderness.  Musculoskeletal:  Pain with range of motion of the left hip, tenderness to palpation over the lateral aspect of the greater trochanter, no foreshortening noted, neurovascularly intact distally with 2+ DP pulse  Neurological: She is alert and oriented to person, place, and time.  Skin: Skin is warm and dry.  Psychiatric: She has a normal mood and affect.    ED Course  Procedures (including critical care time) Labs Review Labs Reviewed  CBC WITH DIFFERENTIAL  BASIC METABOLIC PANEL    Imaging Review Dg Hip Complete Left  03/02/2014   CLINICAL DATA:  History of fall complaining of left hip pain.  EXAM: LEFT HIP - COMPLETE 2+ VIEW  COMPARISON:  No priors.  FINDINGS: Three views of the bony pelvis and the left hip demonstrate a very subtle minimally displaced left side intertrochanteric hip  fracture. Femoral head remains located. Diffuse osteopenia. Visualized portions of the bony pelvis and right proximal femur appear intact. Coarse calcification in the left hemipelvis likely represents a small calcified fibroid.  IMPRESSION: 1. Subtle minimally displaced intertrochanteric fracture of the left hip.   Electronically Signed   By: Vinnie Langton M.D.   On: 03/02/2014 18:32     EKG Interpretation   Date/Time:  Thursday March 02 2014 17:48:24 EDT Ventricular Rate:  97 PR Interval:   227 QRS Duration: 107 QT Interval:  391 QTC Calculation: 497 R Axis:   -72 Text Interpretation:  Sinus rhythm Prolonged PR interval Left anterior  fascicular block Abnormal R-wave progression, early transition Nonspecific  T abnormalities, lateral leads Borderline prolonged QT interval Confirmed  by HORTON  MD, COURTNEY ([id number]) on 03/02/2014 6:19:25 PM      MDM   Final diagnoses:  Hip fracture   Patient presents following a reported mechanical fall. Reporting only left hip discomfort. She is otherwise neurovascularly intact. Screening lab work and EKG were obtained. X-ray shows intertrochanteric left hip fracture. Will discuss with orthopedics and admit to the hospitalist.    Merryl Hacker, MD 03/02/14 1853  Patient last ate at 10am.  Patient scheduled for surgery tomorrow at 12p tomorrow per Dr. Mayer Camel.  Admitted to Dr. Arnoldo Morale.  Merryl Hacker, MD 03/02/14 [phone]

## 2014-03-02 NOTE — Consult Note (Signed)
Reason for Consult: Left hip intertrochanteric fracture impacted 2 part Referring Physician: Dr Jana Hakim  Jacqueline Huber is an 80 y.o. female.  HPI: Household ambulator who lives independently although her daughters are close by. Tripped on some carpet at her home today and fell onto her left hip sustaining an impacted two-part intertrochanteric fracture with pain over the greater trochanteric region. She was transported to [facility] and evaluated in the ED, where x-ray confirmed the diagnosis. She is otherwise relatively healthy her only medical issue seems to be high blood pressure and a history of throat cancer she has not had any prior problems with anesthesia. She denies any other injuries and denies any loss of consciousness  Past Medical History  Diagnosis Date  . Hypertension   . Cancer 1960s    throat cancer    Past Surgical History  Procedure Laterality Date  . Throat cancer surgery  1960's    History reviewed. No pertinent family history.  Social History:  reports that she has never smoked. She has never used smokeless tobacco. She reports that she does not drink alcohol or use illicit drugs.  Allergies: No Known Allergies  Medications: Prior to Admission:  (Not in a hospital admission)  Results for orders placed during the hospital encounter of 03/02/14 (from the past 48 hour(s))  CBC WITH DIFFERENTIAL     Status: Abnormal   Collection Time    03/02/14  6:31 PM      Result Value Ref Range   WBC 9.5  4.0 - 10.5 K/uL   RBC 3.62 (*) 3.87 - 5.11 MIL/uL   Hemoglobin 10.4 (*) 12.0 - 15.0 g/dL   HCT 30.1 (*) 36.0 - 46.0 %   MCV 83.1  78.0 - 100.0 fL   MCH 28.7  26.0 - 34.0 pg   MCHC 34.6  30.0 - 36.0 g/dL   RDW 13.1  11.5 - 15.5 %   Platelets 293  150 - 400 K/uL   Neutrophils Relative % 89 (*) 43 - 77 %   Neutro Abs 8.5 (*) 1.7 - 7.7 K/uL   Lymphocytes Relative 5 (*) 12 - 46 %   Lymphs Abs 0.5 (*) 0.7 - 4.0 K/uL   Monocytes Relative 6  3 - 12 %   Monocytes Absolute 0.6  0.1 - 1.0 K/uL   Eosinophils Relative 0  0 - 5 %   Eosinophils Absolute 0.0  0.0 - 0.7 K/uL   Basophils Relative 0  0 - 1 %   Basophils Absolute 0.0  0.0 - 0.1 K/uL  BASIC METABOLIC PANEL     Status: Abnormal   Collection Time    03/02/14  6:31 PM      Result Value Ref Range   Sodium 138  137 - 147 mEq/L   Potassium 3.4 (*) 3.7 - 5.3 mEq/L   Chloride 96  96 - 112 mEq/L   CO2 24  19 - 32 mEq/L   Glucose, Bld 150 (*) 70 - 99 mg/dL   BUN 17  6 - 23 mg/dL   Creatinine, Ser 0.83  0.50 - 1.10 mg/dL   Calcium 11.4 (*) 8.4 - 10.5 mg/dL   GFR calc non Af Amer 60 (*) >90 mL/min   GFR calc Af Amer 70 (*) >90 mL/min   Comment: (NOTE)     The eGFR has been calculated using the CKD EPI equation.     This calculation has not been validated in all clinical situations.  eGFR's persistently <90 mL/min signify possible Chronic Kidney     Disease.  URINALYSIS, ROUTINE W REFLEX MICROSCOPIC     Status: Abnormal   Collection Time    03/02/14  8:33 PM      Result Value Ref Range   Color, Urine YELLOW  YELLOW   APPearance CLOUDY (*) CLEAR   Specific Gravity, Urine 1.013  1.005 - 1.030   pH 7.5  5.0 - 8.0   Glucose, UA NEGATIVE  NEGATIVE mg/dL   Hgb urine dipstick NEGATIVE  NEGATIVE   Bilirubin Urine NEGATIVE  NEGATIVE   Ketones, ur NEGATIVE  NEGATIVE mg/dL   Protein, ur NEGATIVE  NEGATIVE mg/dL   Urobilinogen, UA 1.0  0.0 - 1.0 mg/dL   Nitrite NEGATIVE  NEGATIVE   Leukocytes, UA NEGATIVE  NEGATIVE   Comment: MICROSCOPIC NOT DONE ON URINES WITH NEGATIVE PROTEIN, BLOOD, LEUKOCYTES, NITRITE, OR GLUCOSE <1000 mg/dL.    Dg Hip Complete Left  03/02/2014   CLINICAL DATA:  History of fall complaining of left hip pain.  EXAM: LEFT HIP - COMPLETE 2+ VIEW  COMPARISON:  No priors.  FINDINGS: Three views of the bony pelvis and the left hip demonstrate a very subtle minimally displaced left side intertrochanteric hip fracture. Femoral head remains located. Diffuse osteopenia.  Visualized portions of the bony pelvis and right proximal femur appear intact. Coarse calcification in the left hemipelvis likely represents a small calcified fibroid.  IMPRESSION: 1. Subtle minimally displaced intertrochanteric fracture of the left hip.   Electronically Signed   By: Vinnie Langton M.D.   On: 03/02/2014 18:32    ROS patient denies any shortness of breath, denies any chest pain, denies any loss of consciousness. Blood pressure 152/64, pulse 101, temperature 97.9 F (36.6 C), temperature source Oral, resp. rate 16, SpO2 100.00%. Physical Exam tender over the left hip greater trochanter, no obvious bruising, the left lower extremity is externally rotated approximately 30 and is a 1 cm short compared to the right foot tap is positive. Pelvic rock and tilt tests are negative. She has normal sensation to her feet. Her toes are warm and well perfused she has good pulses appear  Assessment/Plan: Assessment: Impacted left hip three-part intertrochanteric fracture in a 80 year old woman who is relatively healthy  Plan: Patient will be taken for open reduction internal fixation of her left hip using a dynamic hip screw at approximately 1130 hours 05/01/201 tomorrow. Risks and benefits of surgery discussed with the patient and her family, she will remain n.p.o., and the Foley has or even placed.  [name] 03/02/2014, 10:17 PM

## 2014-03-02 NOTE — ED Notes (Signed)
MD at bedside. 

## 2014-03-02 NOTE — Progress Notes (Signed)
  CARE MANAGEMENT ED NOTE 03/02/2014  Patient:  Jacqueline Huber, Jacqueline Huber   Account Number:  [id number]  Date Initiated:  03/02/2014  Documentation initiated by:  Livia Snellen  Subjective/Objective Assessment:   Patient presents to Ed post fall.     Subjective/Objective Assessment Detail:   Xray left hip 1. Subtle minimally displaced intertrochanteric fracture of the left  hip.     Action/Plan:   Patient to be admitted.  Orthopedic surgery consulted   Action/Plan Detail:   Anticipated DC Date:       Status Recommendation to Physician:   Result of Recommendation:    Other ED Services  Consult Working [place]  CM consult  Other    Choice offered to / List presented to:  C-4 Adult Children          Status of service:  Completed, signed off  ED Comments:   ED Comments Detail:  EDCM spoke to patient and her family at bedside.  Patient's daughter Jacqueline Huber  [phone].  Patient lives with her grand daughter Jacqueline Huber.  Patient does not have any medical equipment at home for herself.  Medical equipment at home belonged to her husband.  Patient does not have any home health services at this time.  EDCM provided patient's daughter with printed list of home health agencies in [facility].  EDCM explained with home health the patient may receive a visiting RN, PT, OT, aide and social worker if needed.  Patient and patient's family thankful for resources.  No further EDCM needs at this time.

## 2014-03-02 NOTE — Progress Notes (Signed)
Clinical Social Work Department BRIEF PSYCHOSOCIAL ASSESSMENT 03/02/2014  Patient:  Jacqueline Huber, Jacqueline Huber     Account Number:  [id number]     Admit date:  03/02/2014  Clinical Social Worker:  Luretha Rued  Date/Time:  03/02/2014 07:30 PM  Referred by:  CSW  Date Referred:  03/02/2014  Other Referral:   Interview type:  Patient Other interview type:   Family was at bedside    PSYCHOSOCIAL DATA Living Status:  FAMILY Admitted from facility:   Level of care:   Primary support name:  [name] Primary support relationship to patient:  CHILD, ADULT Degree of support available:   High level of support    CURRENT CONCERNS  Other Concerns:    SOCIAL WORK ASSESSMENT / PLAN CSW met with the patient and family at bedside to complete this assessment.  Patient was aware of CSW presence in the room but did not respond to any questions.  As CSW asked questions the patient turned to her family for support. CSW provided the patient and family with a SNF resource list for review.  The family reports that the patient lives with her granddaughter and will need rehab post discharge. The family reports they some experience with [facility] and would like the patient to go there.  CSW encouraged the family to investigate and provide the social worker with at the least 3 preferenced facilities although no garuntee. Family reports that [name] (daughter) is the POA.   Assessment/plan status:  Psychosocial Support/Ongoing Assessment of Needs Other assessment/ plan:   Information/referral to community resources:   SNF    PATIENT'S/FAMILY'S RESPONSE TO PLAN OF CARE: Family expressed their appreciation for the support of the social work department.       Chesley Noon, MSW, [place], 03/02/2014 Evening Clinical Social Worker [phone]

## 2014-03-02 NOTE — ED Notes (Signed)
Pt c/o L hip injury after slipping and falling this afternoon.  Pain only w/ movement.  Rotation noted.  Pt is extremely HOH.

## 2014-03-03 ENCOUNTER — Encounter (HOSPITAL_COMMUNITY): Payer: Medicare Other | Admitting: Certified Registered Nurse Anesthetist

## 2014-03-03 ENCOUNTER — Inpatient Hospital Stay (HOSPITAL_COMMUNITY): Payer: Medicare Other

## 2014-03-03 ENCOUNTER — Encounter (HOSPITAL_COMMUNITY): Payer: Self-pay | Admitting: Internal Medicine

## 2014-03-03 ENCOUNTER — Encounter (HOSPITAL_COMMUNITY)
Admission: EM | Disposition: A | Discharge status: Skilled Nursing Facility | Payer: Self-pay | Source: Home / Self Care | Attending: Internal Medicine

## 2014-03-03 ENCOUNTER — Inpatient Hospital Stay (HOSPITAL_COMMUNITY): Payer: Medicare Other | Admitting: Certified Registered Nurse Anesthetist

## 2014-03-03 DIAGNOSIS — E860 Dehydration: Secondary | ICD-10-CM | POA: Diagnosis not present

## 2014-03-03 DIAGNOSIS — S72143A Displaced intertrochanteric fracture of unspecified femur, initial encounter for closed fracture: Secondary | ICD-10-CM | POA: Diagnosis not present

## 2014-03-03 DIAGNOSIS — I10 Essential (primary) hypertension: Secondary | ICD-10-CM | POA: Diagnosis present

## 2014-03-03 DIAGNOSIS — D649 Anemia, unspecified: Secondary | ICD-10-CM | POA: Diagnosis present

## 2014-03-03 DIAGNOSIS — E43 Unspecified severe protein-calorie malnutrition: Secondary | ICD-10-CM | POA: Diagnosis not present

## 2014-03-03 DIAGNOSIS — D638 Anemia in other chronic diseases classified elsewhere: Secondary | ICD-10-CM | POA: Diagnosis not present

## 2014-03-03 HISTORY — PX: INTRAMEDULLARY (IM) NAIL INTERTROCHANTERIC: SHX5875

## 2014-03-03 LAB — PREALBUMIN: PREALBUMIN: 18.4 mg/dL (ref 17.0–34.0)

## 2014-03-03 LAB — IRON AND TIBC
IRON: 12 ug/dL — AB (ref 42–135)
Saturation Ratios: 6 % — ABNORMAL LOW (ref 20–55)
TIBC: 213 ug/dL — ABNORMAL LOW (ref 250–470)
UIBC: 201 ug/dL (ref 125–400)

## 2014-03-03 LAB — FOLATE: Folate: 16.2 ng/mL

## 2014-03-03 LAB — VITAMIN B12: Vitamin B-12: 1061 pg/mL — ABNORMAL HIGH (ref 211–911)

## 2014-03-03 LAB — CBC
HCT: 23.3 % — ABNORMAL LOW (ref 36.0–46.0)
HEMOGLOBIN: 8.1 g/dL — AB (ref 12.0–15.0)
MCH: 29.1 pg (ref 26.0–34.0)
MCHC: 35.2 g/dL (ref 30.0–36.0)
MCV: 82.6 fL (ref 78.0–100.0)
Platelets: 240 10*3/uL (ref 150–400)
RBC: 2.82 MIL/uL — ABNORMAL LOW (ref 3.87–5.11)
RDW: 13.2 % (ref 11.5–15.5)
WBC: 7.8 10*3/uL (ref 4.0–10.5)

## 2014-03-03 LAB — PREPARE RBC (CROSSMATCH)

## 2014-03-03 LAB — FERRITIN: FERRITIN: 302 ng/mL — AB (ref 10–291)

## 2014-03-03 LAB — BASIC METABOLIC PANEL
BUN: 19 mg/dL (ref 6–23)
CO2: 23 mEq/L (ref 19–32)
Calcium: 10.2 mg/dL (ref 8.4–10.5)
Chloride: 101 mEq/L (ref 96–112)
Creatinine, Ser: 0.8 mg/dL (ref 0.50–1.10)
GFR calc non Af Amer: 63 mL/min — ABNORMAL LOW (ref >90)
GFR, EST AFRICAN AMERICAN: 73 mL/min — AB (ref >90)
GLUCOSE: 125 mg/dL — AB (ref 70–99)
POTASSIUM: 4.5 meq/L (ref 3.7–5.3)
Sodium: 136 mEq/L — ABNORMAL LOW (ref 137–147)

## 2014-03-03 LAB — SURGICAL PCR SCREEN
MRSA, PCR: NEGATIVE
Staphylococcus aureus: NEGATIVE

## 2014-03-03 LAB — RETICULOCYTES
RBC.: 2.8 MIL/uL — ABNORMAL LOW (ref 3.87–5.11)
RETIC COUNT ABSOLUTE: 22.4 10*3/uL (ref 19.0–186.0)
Retic Ct Pct: 0.8 % (ref 0.4–3.1)

## 2014-03-03 LAB — ABO/RH: ABO/RH(D): B POS

## 2014-03-03 SURGERY — FIXATION, FRACTURE, INTERTROCHANTERIC, WITH INTRAMEDULLARY ROD
Anesthesia: Spinal | Site: Hip | Laterality: Left

## 2014-03-03 MED ORDER — PHENYLEPHRINE 40 MCG/ML (10ML) SYRINGE FOR IV PUSH (FOR BLOOD PRESSURE SUPPORT)
PREFILLED_SYRINGE | INTRAVENOUS | Status: AC
  Filled 2014-03-03: qty 10

## 2014-03-03 MED ORDER — PROPOFOL 10 MG/ML IV BOLUS
INTRAVENOUS | Status: AC
  Filled 2014-03-03: qty 20

## 2014-03-03 MED ORDER — FENTANYL CITRATE 0.05 MG/ML IJ SOLN: 25.0000 ug | INTRAMUSCULAR | Status: DC | PRN

## 2014-03-03 MED ORDER — CEFAZOLIN SODIUM-DEXTROSE 2-3 GM-% IV SOLR
INTRAVENOUS | Status: AC
  Filled 2014-03-03: qty 50

## 2014-03-03 MED ORDER — LIDOCAINE HCL (CARDIAC) 20 MG/ML IV SOLN
INTRAVENOUS | Status: AC
  Filled 2014-03-03: qty 5

## 2014-03-03 MED ORDER — PROMETHAZINE HCL 25 MG/ML IJ SOLN: 6.2500 mg | INTRAMUSCULAR | Status: DC | PRN

## 2014-03-03 MED ORDER — BUPIVACAINE HCL (PF) 0.5 % IJ SOLN
INTRAMUSCULAR | Status: DC | PRN
  Administered 2014-03-03: 3 mL

## 2014-03-03 MED ORDER — ONDANSETRON HCL 4 MG/2ML IJ SOLN: 4.0000 mg | Freq: Four times a day (QID) | INTRAMUSCULAR | Status: DC | PRN

## 2014-03-03 MED ORDER — ASPIRIN EC 325 MG PO TBEC
325.0000 mg | DELAYED_RELEASE_TABLET | Freq: Every day | ORAL | Status: DC
  Administered 2014-03-04 – 2014-03-05 (×2): 325 mg via ORAL
  Filled 2014-03-03 (×4): qty 1

## 2014-03-03 MED ORDER — MEPERIDINE HCL 50 MG/ML IJ SOLN: 6.2500 mg | INTRAMUSCULAR | Status: DC | PRN

## 2014-03-03 MED ORDER — PHENYLEPHRINE HCL 10 MG/ML IJ SOLN
INTRAMUSCULAR | Status: DC | PRN
  Administered 2014-03-03 (×5): 80 ug via INTRAVENOUS

## 2014-03-03 MED ORDER — FENTANYL CITRATE 0.05 MG/ML IJ SOLN
INTRAMUSCULAR | Status: AC
  Filled 2014-03-03: qty 5

## 2014-03-03 MED ORDER — MENTHOL 3 MG MT LOZG
1.0000 | LOZENGE | OROMUCOSAL | Status: DC | PRN
  Filled 2014-03-03: qty 9

## 2014-03-03 MED ORDER — METOCLOPRAMIDE HCL 10 MG PO TABS: 5.0000 mg | ORAL_TABLET | Freq: Three times a day (TID) | ORAL | Status: DC | PRN

## 2014-03-03 MED ORDER — PROPOFOL 10 MG/ML IV BOLUS
INTRAVENOUS | Status: DC | PRN
  Administered 2014-03-03: 20 mg via INTRAVENOUS

## 2014-03-03 MED ORDER — HYDROCODONE-ACETAMINOPHEN 5-325 MG PO TABS: 1.0000 | ORAL_TABLET | Freq: Four times a day (QID) | ORAL | Status: DC | PRN

## 2014-03-03 MED ORDER — PROPOFOL INFUSION 10 MG/ML OPTIME
INTRAVENOUS | Status: DC | PRN
  Administered 2014-03-03: 25 ug/kg/min via INTRAVENOUS

## 2014-03-03 MED ORDER — FENTANYL CITRATE 0.05 MG/ML IJ SOLN
INTRAMUSCULAR | Status: DC | PRN
  Administered 2014-03-03 (×2): 25 ug via INTRAVENOUS

## 2014-03-03 MED ORDER — BUPIVACAINE HCL (PF) 0.5 % IJ SOLN
INTRAMUSCULAR | Status: AC
  Filled 2014-03-03: qty 30

## 2014-03-03 MED ORDER — LACTATED RINGERS IV SOLN
INTRAVENOUS | Status: DC
  Administered 2014-03-03: 1000 mL via INTRAVENOUS

## 2014-03-03 MED ORDER — PHENOL 1.4 % MT LIQD
1.0000 | OROMUCOSAL | Status: DC | PRN
  Filled 2014-03-03: qty 177

## 2014-03-03 MED ORDER — HYDRALAZINE HCL 20 MG/ML IJ SOLN
5.0000 mg | Freq: Four times a day (QID) | INTRAMUSCULAR | Status: DC | PRN
  Filled 2014-03-03: qty 0.25

## 2014-03-03 MED ORDER — KETAMINE HCL 10 MG/ML IJ SOLN
INTRAMUSCULAR | Status: DC | PRN
  Administered 2014-03-03: 30 mg via INTRAVENOUS

## 2014-03-03 MED ORDER — ONDANSETRON HCL 4 MG PO TABS: 4.0000 mg | ORAL_TABLET | Freq: Four times a day (QID) | ORAL | Status: DC | PRN

## 2014-03-03 MED ORDER — CEFAZOLIN SODIUM-DEXTROSE 2-3 GM-% IV SOLR
2.0000 g | Freq: Once | INTRAVENOUS | Status: AC
  Administered 2014-03-03: 2 g via INTRAVENOUS

## 2014-03-03 MED ORDER — KETAMINE HCL 10 MG/ML IJ SOLN
INTRAMUSCULAR | Status: AC
  Filled 2014-03-03: qty 1

## 2014-03-03 MED ORDER — HYDROMORPHONE HCL PF 1 MG/ML IJ SOLN
0.1000 mg | INTRAMUSCULAR | Status: DC | PRN
  Administered 2014-03-04: 0.1 mg via INTRAVENOUS
  Filled 2014-03-03: qty 1

## 2014-03-03 MED ORDER — ONDANSETRON HCL 4 MG/2ML IJ SOLN
INTRAMUSCULAR | Status: DC | PRN
  Administered 2014-03-03: 4 mg via INTRAVENOUS

## 2014-03-03 MED ORDER — METOCLOPRAMIDE HCL 5 MG/ML IJ SOLN: 5.0000 mg | Freq: Three times a day (TID) | INTRAMUSCULAR | Status: DC | PRN

## 2014-03-03 MED ORDER — KCL IN DEXTROSE-NACL 20-5-0.45 MEQ/L-%-% IV SOLN
INTRAVENOUS | Status: DC
  Administered 2014-03-03 – 2014-03-04 (×2): via INTRAVENOUS
  Filled 2014-03-03 (×3): qty 1000

## 2014-03-03 SURGICAL SUPPLY — 44 items
BAG SPEC THK2 15X12 ZIP CLS (MISCELLANEOUS) ×1
BAG ZIPLOCK 12X15 (MISCELLANEOUS) ×3 IMPLANT
BIT DRILL 3.8MM (BIT) ×1
BIT DRILL TWIST 3.8X7 (BIT) ×2 IMPLANT
BNDG COHESIVE 6X5 TAN STRL LF (GAUZE/BANDAGES/DRESSINGS) ×3 IMPLANT
DRAPE C-ARM 42X120 X-RAY (DRAPES) ×3 IMPLANT
DRAPE INCISE IOBAN 66X45 STRL (DRAPES) ×3 IMPLANT
DRAPE LG THREE QUARTER DISP (DRAPES) ×3 IMPLANT
DRAPE ORTHO SPLIT 77X108 STRL (DRAPES) ×4
DRAPE STERI IOBAN 125X83 (DRAPES) ×3 IMPLANT
DRAPE SURG ORHT 6 SPLT 77X108 (DRAPES) ×2 IMPLANT
DRAPE U-SHAPE 47X51 STRL (DRAPES) ×3 IMPLANT
DRSG MEPILEX BORDER 4X8 (GAUZE/BANDAGES/DRESSINGS) ×3 IMPLANT
DRSG PAD ABDOMINAL 8X10 ST (GAUZE/BANDAGES/DRESSINGS) ×3 IMPLANT
DURAPREP 26ML APPLICATOR (WOUND CARE) ×3 IMPLANT
ELECT REM PT RETURN 9FT ADLT (ELECTROSURGICAL) ×3
ELECTRODE REM PT RTRN 9FT ADLT (ELECTROSURGICAL) ×1 IMPLANT
FACESHIELD WRAPAROUND (MASK) IMPLANT
GLOVE BIO SURGEON STRL SZ7 (GLOVE) ×3 IMPLANT
GLOVE BIO SURGEON STRL SZ7.5 (GLOVE) ×3 IMPLANT
GLOVE BIO SURGEON STRL SZ8 (GLOVE) ×3 IMPLANT
GLOVE BIOGEL PI IND STRL 7.0 (GLOVE) ×1 IMPLANT
GLOVE BIOGEL PI IND STRL 8 (GLOVE) ×1 IMPLANT
GLOVE BIOGEL PI INDICATOR 7.0 (GLOVE) ×2
GLOVE BIOGEL PI INDICATOR 8 (GLOVE) ×2
KIT BASIN OR (CUSTOM PROCEDURE TRAY) ×3 IMPLANT
PACK GENERAL/GYN (CUSTOM PROCEDURE TRAY) ×3 IMPLANT
PACK LOWER EXTREMITY WL (CUSTOM PROCEDURE TRAY) ×3 IMPLANT
PAD CAST 4YDX4 CTTN HI CHSV (CAST SUPPLIES) ×1 IMPLANT
PADDING CAST COTTON 4X4 STRL (CAST SUPPLIES) ×3
PIN THREADED GUIDE ACE (PIN) ×3 IMPLANT
PLATE COMP 135D 4H STD (Plate) ×3 IMPLANT
POSITIONER SURGICAL ARM (MISCELLANEOUS) ×3 IMPLANT
SCREW ACECAP 38MM (Screw) ×6 IMPLANT
SCREW ACECAP 42MM (Screw) ×6 IMPLANT
SCREW LAG 80MM (Screw) ×3 IMPLANT
SCREW LAG STD 80XKY LRG STRL H (Screw) ×1 IMPLANT
SPONGE GAUZE 4X4 12PLY (GAUZE/BANDAGES/DRESSINGS) ×3 IMPLANT
STAPLER VISISTAT (STAPLE) ×3 IMPLANT
STOCKINETTE 8 INCH (MISCELLANEOUS) ×3 IMPLANT
SUT VIC AB 2-0 CT1 27 (SUTURE)
SUT VIC AB 2-0 CT1 27XBRD (SUTURE) IMPLANT
TOWEL OR 17X26 10 PK STRL BLUE (TOWEL DISPOSABLE) ×3 IMPLANT
TRAY FOLEY CATH 14FRSI W/METER (CATHETERS) ×3 IMPLANT

## 2014-03-03 NOTE — Anesthesia Preprocedure Evaluation (Addendum)
Anesthesia Evaluation  Patient identified by MRN, date of birth, ID band Patient awake    Reviewed: Allergy & Precautions, H&P , NPO status , Patient's Chart, lab work & pertinent test results  Airway Mallampati: II TM Distance: >3 FB Neck ROM: Full    Dental no notable dental hx. (+) Edentulous Upper, Edentulous Lower   Pulmonary neg pulmonary ROS,  breath sounds clear to auscultation  Pulmonary exam normal       Cardiovascular hypertension, Pt. on medications Rhythm:Regular Rate:Normal     Neuro/Psych negative neurological ROS  negative psych ROS   GI/Hepatic negative GI ROS, Neg liver ROS,   Endo/Other  negative endocrine ROS  Renal/GU negative Renal ROS  negative genitourinary   Musculoskeletal negative musculoskeletal ROS (+)   Abdominal   Peds negative pediatric ROS (+)  Hematology negative hematology ROS (+)   Anesthesia Other Findings   Reproductive/Obstetrics negative OB ROS                          Anesthesia Physical Anesthesia Plan  ASA: II  Anesthesia Plan: Spinal   Post-op Pain Management:    Induction:   Airway Management Planned: Simple Face Mask  Additional Equipment:   Intra-op Plan:   Post-operative Plan:   Informed Consent: I have reviewed the patients History and Physical, chart, labs and discussed the procedure including the risks, benefits and alternatives for the proposed anesthesia with the patient or authorized representative who has indicated his/her understanding and acceptance.   Dental advisory given  Plan Discussed with: CRNA  Anesthesia Plan Comments:         Anesthesia Quick Evaluation

## 2014-03-03 NOTE — Anesthesia Procedure Notes (Signed)
Spinal Patient location during procedure: OR Staffing Anesthesiologist: Pammy Vesey Performed by: anesthesiologist  Preanesthetic Checklist Completed: patient identified, site marked, surgical consent, pre-op evaluation, timeout performed, IV checked, risks and benefits discussed and monitors and equipment checked Spinal Block Patient position: left lateral decubitus Prep: Betadine Patient monitoring: heart rate, continuous pulse ox and blood pressure Approach: right paramedian Location: L2-3 Injection technique: single-shot Needle Needle type: Spinocan  Needle gauge: 22 G Needle length: 9 cm Additional Notes Expiration date of kit checked and confirmed. Patient tolerated procedure well, without complications.     

## 2014-03-03 NOTE — Anesthesia Postprocedure Evaluation (Signed)
  Anesthesia Post-op Note  Patient: Jacqueline Huber  Procedure(s) Performed: Procedure(s) (LRB): INTRAMEDULLARY (IM) NAIL INTERTROCHANTRIC (Left)  Patient Location: PACU  Anesthesia Type: Spinal  Level of Consciousness: awake and alert   Airway and Oxygen Therapy: Patient Spontanous Breathing  Post-op Pain: mild  Post-op Assessment: Post-op Vital signs reviewed, Patient's Cardiovascular Status Stable, Respiratory Function Stable, Patent Airway and No signs of Nausea or vomiting  Last Vitals:  Filed Vitals:   03/03/14 1545  BP: 145/80  Pulse: 89  Temp:   Resp: 15    Post-op Vital Signs: stable   Complications: No apparent anesthesia complications

## 2014-03-03 NOTE — Progress Notes (Signed)
Patient ID: Jacqueline Huber, female   DOB: 1921-03-31, 80 y.o.   MRN: [id number] [facility] PROGRESS NOTE  Jacqueline Huber JKK:[id number] DOB: 03/21/1921 DOA: 03/02/2014 PCP: [name], MD  Brief narrative: 80 y.o. female who was brought to the ED after suffering a mechanical fall in her home onto her left side. She lives independently and is able to perform all of her ADLs. She has lost considerable weight over the past 2 years due to poor appetite since the loss of her husband. In the ED, she was evaluated and found to have a minimally displaced intertrochanteric fracture of the Left hip. Orthopedic Surgeon Dr Mayer Camel was consulted.  Active Problems:   Hip fracture, left - plan to take to OR today - will need PT evaluation post op - pt willing to go to SNF when medically ready    Unspecified essential hypertension - reasonable inpatient control    Anemia of chronic disease - drop in Hg overnight - no signs of active bleeding - will monitor closely    Hypercalcemia - from dehydration, started on IVF and now resolved  - provide IVF and repeat BMP in AM   Protein-calorie malnutrition, severe - advance diet as pt able to tolerate   Consultants:  Ortho   Procedures/Studies:  Dg Hip Complete Left  03/02/2014  Subtle minimally displaced intertrochanteric fracture of the left hip.   Antibiotics:  None  Code Status: Full Family Communication: Multiple family members at bedside Disposition Plan: SNF Monday   HPI/Subjective: No events overnight.   Objective: Filed Vitals:   03/02/14 1733 03/02/14 2215 03/03/14 0531 03/03/14 1050  BP: 152/64 126/77 106/68 130/70  Pulse: 101 92 77 75  Temp: 97.9 F (36.6 C) 98.4 F (36.9 C) 98.6 F (37 C) 98 F (36.7 C)  TempSrc: Oral Oral Oral Oral  Resp: 16 16 16 16   Height:  5' (1.524 m)    Weight:  47 kg (103 lb 9.9 oz)    SpO2:  100% 100% 100%    Intake/Output Summary (Last 24 hours) at 03/03/14 1150 Last data filed at  03/03/14 0829  Gross per 24 hour  Intake  487.5 ml  Output    225 ml  Net  262.5 ml    Exam:   General:  Pt is alert, follows commands appropriately, not in acute distress  Cardiovascular: Regular rate and rhythm, S1/S2, no murmurs, no rubs, no gallops  Respiratory: Clear to auscultation bilaterally, no wheezing, diminished breath sounds at bases   Abdomen: Soft, non tender, non distended, bowel sounds present, no guarding  Extremities: No edema, pulses DP and PT palpable bilaterally   Data Reviewed: Basic Metabolic Panel:  Recent Labs Lab 03/02/14 1831 03/03/14 0402  NA 138 136*  K 3.4* 4.5  CL 96 101  CO2 24 23  GLUCOSE 150* 125*  BUN 17 19  CREATININE 0.83 0.80  CALCIUM 11.4* 10.2   CBC:  Recent Labs Lab 03/02/14 1831 03/03/14 0402  WBC 9.5 7.8  NEUTROABS 8.5*  --   HGB 10.4* 8.1*  HCT 30.1* 23.3*  MCV 83.1 82.6  PLT 293 240    Recent Results (from the past 240 hour(s))  SURGICAL PCR SCREEN     Status: None   Collection Time    03/03/14  9:50 AM      Result Value Ref Range Status   MRSA, PCR NEGATIVE  NEGATIVE Final   Staphylococcus aureus NEGATIVE  NEGATIVE Final   Comment:  The Xpert SA Assay (FDA     approved for NASAL specimens     in patients over 32 years of age),     is one component of     a comprehensive surveillance     program.  Test performance has     been validated by [employer] for patients greater     than or equal to 29 year old.     It is not intended     to diagnose infection nor to     guide or monitor treatment.     Scheduled Meds: .  ceFAZolin (ANCEF) IV  2 g Intravenous Once   Continuous Infusions: . sodium chloride 75 mL/hr at 03/02/14 2330   Theodis Blaze, MD  [facility] Pager [phone]  If 7PM-7AM, please contact night-coverage [email or web address] Password [email or web address] 03/03/2014, 11:50 AM   LOS: 1 day

## 2014-03-03 NOTE — Op Note (Signed)
DATE OF PROCEDURE: 06/10/2012  PREOPERATIVE DIAGNOSIS: L hip intertrochanteric fracture 2-part with varus displacement  POSTOPERATIVE DIAGNOSIS: Same  PROCEDURE: Open reduction internal fixation left hip intertrochanteric fracture using a DePuy TK2 4 hole 135 short barrel sideplate, 80 mm lag screw keyed  SURGEON: Migel Hannis J  ASSISTANT: Eric K. Barton Dubois  (present throughout entire procedure and necessary for timely completion of the procedure) ANESTHESIA: General  BLOOD LOSS: 200 cc  FLUID REPLACEMENT: 1200 cc crystalloid  DRAINS: Foley Catheter  URINE OUTPUT: 371IR  COMPLICATIONS: none   INDICATIONS FOR PROCEDURE: L hip intertrochanteric fracture, 2-part, sustained from a fal. Patient. presented to the emergency room, was admitted by the medicine service and orthopedic consultation was obtained. To decrease pain and increase function we have recommended open reduction internal fixation using a dynamic hip screw. The risks, benefits, and alternatives were discussed at length including but not limited to the risks of infection, bleeding, nerve injury, stiffness, blood clots, the need for revision surgery, cardiopulmonary complications, among others, and they were willing to proceed. Benefits have been discussed. Questions answered.   PROCEDURE IN DETAIL: The patient was identified by armband,  received preoperative IV antibiotics in the holding area, taken to the operating room , appropriate anesthetic monitors were attached and general endotracheal anesthesia induced. Pt. was then transferred to a radiolucent flat Jackson table, rolled into the R lateral decubitus position and fixed there with a Stulberg Mark 2 pelvic clamp. Under C-arm imaging control we then performed a closed reduction with abduction and internal rotation obtaining a near-anatomic reduction with the leg in full extension. The lateral aspect of the hip and thigh was then prepped and draped in usual sterile fashion in the  iliac crest to the knee. A timeout procedure was performed. A 10 centimeter incision starting out at the flare of the greater trochanter and going distally was made along the lateral thigh through the skin and subcutaneous tissue down to the level of the IT band which was then cut in line with the skin incision exposing the vastus lateralis. This was likewise split taking [place] down to the flare of the greater trochanter and down the lateral side of the femur for about 8-10 cm. Under C-arm image control we then placed a guide pin at at 135 angle to the lateral flare of the greater trochanter up the femoral neck and into the center of the femoral head on the AP and lateral views. This measured at 90 mm and the triple reamer was set at 80 mm. Bone quality was actually quite good during the reaming. We then used to tap to prepare the drill hole for an 80 mm lag screw, which was placed without difficulty over the guide pin. We then selected a 135 4-hole sideplate placed it over the lag screw and fixed it to the lateral femur with 4 bicortical 4.5 mm screws. C-arm images were taken confirming a near-anatomic reduction. The wound was then thoroughly irrigated with normal saline solution. The vastus lateralis was closed with running 0 Vicryl suture, the IT band with running #1 Vicryl suture, the subcutaneous tissue with 0 and 2-0 undyed Vicryl suture and the skin with 3-0 vicryl. A dressing of Mepilex was then applied the patient was unclamped rolled supine awakened extubated and taken to the recovery room without difficulty.  Frederik Pear J  06/10/2012, 7:29 PM

## 2014-03-03 NOTE — H&P (Signed)
[facility] History and Physical  KIRSTA PROBERT FXT:[id number] DOB: 02-15-1921 DOA: 03/02/2014  Referring physician: EDP PCP: [name], MD  Specialists: Dr. [facility]  Chief Complaint:   HPI: Jacqueline Huber is a 80 y.o. female who was brought to the ED after suffering a mechanical fall in her home onto her left side.  She has an unsteady gait and walks holding on to walls and per her daughters who are at the bedside, she should use her walker  Or cane but she usually does not.   She lives independently and is able to perform all of her ADLs.  She has lost considerable weight over the past 2 years due to poor appetite since the loss of her husband.   In the ED, she was evaluated and found to have a minimally displaced intertrochanteric fracture of the Left hip.   Orthopedic Surgeon Dr Mayer Camel was contacted and consulted and is to see her in the AM for possible surgery at noon.    She was referred for medical admission.      Review of Systems:  Constitutional: No +Weight Loss, No Night Sweats, Fevers, Chills, Fatigue, or Generalized Weakness HEENT: No Headaches, Difficulty Swallowing,Tooth/Dental Problems,Sore Throat,  No Sneezing, Rhinitis, Ear Ache, Nasal Congestion, or Post Nasal Drip,  Cardio-vascular:  No Chest pain, Orthopnea, PND, Edema in lower extremities, Anasarca, Dizziness, Palpitations  Resp: No Dyspnea, No DOE, No Productive Cough, No Non-Productive Cough, No Hemoptysis, No Change in Color of Mucus,  No Wheezing.    GI: No Heartburn, Indigestion, Abdominal Pain, Nausea, Vomiting, Diarrhea, Change in Bowel Habits, +Loss of Appetite  GU: No Dysuria, Change in Color of Urine, No Urgency or Frequency.  No flank pain.  Musculoskeletal: +Left Hip Pain, Decreased Range of Motion of Left leg,  No Back Pain.  Neurologic: No Syncope, No Seizures, Muscle Weakness, Paresthesia, Vision Disturbance or Loss, No Diplopia, +HOH, No Vertigo, +Gait Instability.    Skin: No Rash or  Lesions. Psych: No Change in Mood or Affect. +Depression or Anxiety. No Memory loss. No Confusion or Hallucinations   Past Medical History  Diagnosis Date  . Hypertension   . Cancer 1960s    throat cancer      Past Surgical History  Procedure Laterality Date  . Throat cancer surgery  1960's       Prior to Admission medications   Medication Sig Start Date End Date Taking? Authorizing Provider  amLODipine (NORVASC) 10 MG tablet Take 10 mg by mouth daily.     Yes Historical Provider, MD  moexipril-hydrochlorothiazide (UNIRETIC) 15-25 MG per tablet Take 1 tablet by mouth daily.  01/20/14  Yes Historical Provider, MD  polyethylene glycol (MIRALAX / GLYCOLAX) packet Take 17 g by mouth daily.    Yes Historical Provider, MD      No Known Allergies   Social History:  reports that she has never smoked. She has never used smokeless tobacco. She reports that she does not drink alcohol or use illicit drugs.     Family History  Problem Relation Age of Onset  . Hypertension         Physical Exam:  GEN:  Pleasant Elderly Cachectic  80 y.o. African American female  examined  and in no acute distress; cooperative with exam Filed Vitals:   03/02/14 1731 03/02/14 1733 03/02/14 2215  BP:  152/64   Pulse:  101   Temp:  97.9 F (36.6 C)   TempSrc:  Oral   Resp:  16  Height:   5' (1.524 m)  SpO2: 100%     Blood pressure 152/64, pulse 101, temperature 97.9 F (36.6 C), temperature source Oral, resp. rate 16, height 5' (1.524 m), SpO2 100.00%. PSYCH: She is alert and oriented x4; does not appear anxious does not appear depressed; affect is normal HEENT: Normocephalic and Atraumatic, Mucous membranes pink; PERRLA; EOM intact; Fundi:  Benign;  No scleral icterus, Nares: Patent, Oropharynx: Clear, Edentulous, Neck:  FROM, no cervical lymphadenopathy nor thyromegaly or carotid bruit; no JVD; Breasts:: Not examined CHEST WALL: No tenderness CHEST: Normal respiration, clear to  auscultation bilaterally HEART: Regular rate and rhythm; no murmurs rubs or gallops BACK: No kyphosis or scoliosis; no CVA tenderness ABDOMEN: Positive Bowel Sounds, Scaphoid, soft non-tender; no masses, no organomegaly. Rectal Exam: Not done EXTREMITIES:Left Lower Extremity laterally rotated.   No cyanosis, clubbing or edema; no ulcerations. Genitalia: not examined PULSES: 2+ and symmetric SKIN: Normal hydration no rash or ulceration CNS:  Alert and Oriented x 4, Extremely HOH, otherwise [place] Nerves are Intact , and No focal Deficits Vascular: pulses palpable throughout    Labs on Admission:  Basic Metabolic Panel:  Recent Labs Lab 03/02/14 1831  NA 138  K 3.4*  CL 96  CO2 24  GLUCOSE 150*  BUN 17  CREATININE 0.83  CALCIUM 11.4*   Liver Function Tests: No results found for this basename: AST, ALT, ALKPHOS, BILITOT, PROT, ALBUMIN,  in the last 168 hours No results found for this basename: LIPASE, AMYLASE,  in the last 168 hours No results found for this basename: AMMONIA,  in the last 168 hours CBC:  Recent Labs Lab 03/02/14 1831  WBC 9.5  NEUTROABS 8.5*  HGB 10.4*  HCT 30.1*  MCV 83.1  PLT 293   Cardiac Enzymes: No results found for this basename: CKTOTAL, CKMB, CKMBINDEX, TROPONINI,  in the last 168 hours  BNP (last 3 results) No results found for this basename: PROBNP,  in the last 8760 hours CBG: No results found for this basename: GLUCAP,  in the last 168 hours  Radiological Exams on Admission: Dg Hip Complete Left  03/02/2014   CLINICAL DATA:  History of fall complaining of left hip pain.  EXAM: LEFT HIP - COMPLETE 2+ VIEW  COMPARISON:  No priors.  FINDINGS: Three views of the bony pelvis and the left hip demonstrate a very subtle minimally displaced left side intertrochanteric hip fracture. Femoral head remains located. Diffuse osteopenia. Visualized portions of the bony pelvis and right proximal femur appear intact. Coarse calcification in the left  hemipelvis likely represents a small calcified fibroid.  IMPRESSION: 1. Subtle minimally displaced intertrochanteric fracture of the left hip.   Electronically Signed   By: Vinnie Langton M.D.   On: 03/02/2014 18:32      EKG: Independently reviewed. Normal Sinus Rhythm  Rate 97 NO acute S-T changes.       Assessment/Plan:   80 y.o. female with  Active Problems:   Hip fracture   Closed left hip fracture   Unspecified essential hypertension   Anemia   Hypercalcemia    Cachexia,/ Malnutrition.      Depression   1.  Closed Hip Fracture- Due to Mechanical Fall, Pain Control with IV dilaudid, NPO after Midnight, and Orthopedic Surgery  Dr Mayer Camel to see.     2.  HTN-   On Amlodipine, and Uniretic RX, PRN IV Hydralazine.     3.  Anemia-  Monitor Trend,  Send anemia panel.   And  Type and Screen.    4.  Hypercalcemia-  Monitor Calcium levels, IVFs for hydration, If continue elevation may need further workup for occult malignancy causing a paraneoplastic syndrome.     5.  Cachexia-   Check Albumin and PreAlbumin,  Consult Nutrition for nutrition Goals ,  Ensure Supplements TID with Meals, and May need Appetite Stimulant Rx.     6.  Depression-  continued Grief Reaction from loss of Husband 2 years ago causing poor appetite.   Consider starting a TCA for depression since the side affect is weight gain.     9.   SCDs for DVT prophylaxis.         Code Status:  FULL CODE Family Communication:   Family at Bedside Disposition Plan:   Inpatient      Time spent:  [facility] Pager [phone]  If 7PM-7AM, please contact night-coverage [email or web address] Password [email or web address] 03/03/2014, 12:52 AM

## 2014-03-03 NOTE — Transfer of Care (Signed)
Immediate Anesthesia Transfer of Care Note  Patient: Jacqueline Huber  Procedure(s) Performed: Procedure(s) (LRB): INTRAMEDULLARY (IM) NAIL INTERTROCHANTRIC (Left)  Patient Location: PACU  Anesthesia Type: Spinal  Level of Consciousness: sedated, patient cooperative and responds to stimulation  Airway & Oxygen Therapy: Patient Spontanous Breathing and Patient connected to face mask oxgen  Post-op Assessment: Report given to PACU RN and Post -op Vital signs reviewed and stable  Post vital signs: Reviewed and stable  Complications: No apparent anesthesia complications

## 2014-03-03 NOTE — Progress Notes (Signed)
CSW met with pt / family this am to assist with d/c planning. ST Rehab will be needed following hospital d/c. Pt/ family are requesting [facility] for rehab. SNF has been contacted and expects to have an opening on Monday if pt is stable for d/c. CSW will continue to follow to assist with d/c planning.  Werner Lean LCSW

## 2014-03-03 NOTE — Progress Notes (Signed)
INITIAL NUTRITION ASSESSMENT  Pt meets criteria for severe MALNUTRITION in the context of chronic illness as evidenced by severe muscle wasting and subcutaneous fat loss throughout body.  DOCUMENTATION CODES Per approved criteria  -Severe malnutrition in the context of chronic illness   INTERVENTION: - Diet advancement per MD - Recommend Ensure Complete BID - strawberry, once diet advanced - Encouraged increased protein intake at home to build muscle strength as well as increased calorie intake  - Will continue to monitor   NUTRITION DIAGNOSIS: Inadequate oral intake related to inability to eat as evidenced by NPO.    Goal: Advance diet as tolerated to regular diet  Monitor:  Weights, labs, diet advancement  Reason for Assessment: Consult for assessment   80 y.o. female  Admitting Dx: fall   ASSESSMENT: Pt is a 80 y.o. female who was brought to the ED after suffering a mechanical fall in her home onto her left side. She lives independently and is able to perform all of her ADLs. She has lost considerable weight over the past 2 years due to poor appetite since the loss of her husband. In the ED, she was evaluated and found to have a minimally displaced intertrochanteric fracture of the Left hip.   Met with pt and family who report pt has lost 75-100 pounds in the past 2 years since the loss of her husband. Pt visibly cachetic. Pt reports she eats an egg with toast for breakfast and mashed potatoes with gravy for dinner in addition to drinking 1-2 Ensures/day. Pt denies any problems chewing or swallowing.      Height: Ht Readings from Last 1 Encounters:  03/02/14 5' (1.524 m)    Weight: Wt Readings from Last 1 Encounters:  03/02/14 103 lb 9.9 oz (47 kg)    Ideal Body Weight: 100 lbs  % Ideal Body Weight: 103%  Wt Readings from Last 10 Encounters:  03/02/14 103 lb 9.9 oz (47 kg)  03/02/14 103 lb 9.9 oz (47 kg)    Usual Body Weight: 178-203 lbs per family  %  Usual Body Weight: 58%  BMI:  Body mass index is 20.24 kg/(m^2).  Estimated Nutritional Needs: Kcal: 1400-1600 Protein: 55-70g Fluid: 1.4-1.6L/day  Skin: Intact  Diet Order: NPO  EDUCATION NEEDS: -No education needs identified at this time   Intake/Output Summary (Last 24 hours) at 03/03/14 0926 Last data filed at 03/03/14 0829  Gross per 24 hour  Intake  487.5 ml  Output    225 ml  Net  262.5 ml    Last BM: 4/30  Labs:   Recent Labs Lab 03/02/14 1831 03/03/14 0402  NA 138 136*  K 3.4* 4.5  CL 96 101  CO2 24 23  BUN 17 19  CREATININE 0.83 0.80  CALCIUM 11.4* 10.2  GLUCOSE 150* 125*    CBG (last 3)  No results found for this basename: GLUCAP,  in the last 72 hours  Scheduled Meds: .  ceFAZolin (ANCEF) IV  2 g Intravenous Once    Continuous Infusions: . sodium chloride 75 mL/hr at 03/02/14 2330    Past Medical History  Diagnosis Date  . Hypertension   . Cancer 1960s    throat cancer    Past Surgical History  Procedure Laterality Date  . Throat cancer surgery  1960's    Mikey College MS, RD, [place] Pager [phone] After Hours Pager

## 2014-03-03 NOTE — Progress Notes (Signed)
Clinical Social Work Department CLINICAL SOCIAL WORK PLACEMENT NOTE 03/03/2014  Patient:  Jacqueline Huber, Jacqueline Huber  Account Number:  [id number] Admit date:  03/02/2014  Clinical Social Worker:  Werner Lean, LCSW  Date/time:  03/03/2014 03:22 PM  Clinical Social Work is seeking post-discharge placement for this patient at the following level of care:   SKILLED NURSING   (*CSW will update this form in Epic as items are completed)   03/03/2014  Patient/family provided with [place] Department of Clinical Social Work's list of facilities offering this level of care within the geographic area requested by the patient (or if unable, by the patient's family).  03/03/2014  Patient/family informed of their freedom to choose among providers that offer the needed level of care, that participate in Medicare, Medicaid or managed care program needed by the patient, have an available bed and are willing to accept the patient.    Patient/family informed of [facility]' ownership interest in [facility], as well as of the fact that they are under no obligation to receive care at this facility.  PASARR submitted to EDS on 03/02/2014 PASARR number received from EDS on 03/02/2014  FL2 transmitted to all facilities in geographic area requested by pt/family on  03/03/2014 FL2 transmitted to all facilities within larger geographic area on   Patient informed that his/her managed care company has contracts with or will negotiate with  certain facilities, including the following:     Patient/family informed of bed offers received:  03/03/2014 Patient chooses bed at [facility] Physician recommends and patient chooses bed at    Patient to be transferred to  on   Patient to be transferred to facility by   The following physician request were entered in Epic:   Additional Comments:  Werner Lean LCSW [phone]

## 2014-03-04 LAB — BASIC METABOLIC PANEL
BUN: 15 mg/dL (ref 6–23)
CHLORIDE: 104 meq/L (ref 96–112)
CO2: 26 meq/L (ref 19–32)
CREATININE: 0.93 mg/dL (ref 0.50–1.10)
Calcium: 9.5 mg/dL (ref 8.4–10.5)
GFR calc Af Amer: 61 mL/min — ABNORMAL LOW (ref >90)
GFR calc non Af Amer: 53 mL/min — ABNORMAL LOW (ref >90)
Glucose, Bld: 98 mg/dL (ref 70–99)
POTASSIUM: 4.4 meq/L (ref 3.7–5.3)
Sodium: 140 mEq/L (ref 137–147)

## 2014-03-04 LAB — CBC
HCT: 27.3 % — ABNORMAL LOW (ref 36.0–46.0)
Hemoglobin: 9.4 g/dL — ABNORMAL LOW (ref 12.0–15.0)
MCH: 28.7 pg (ref 26.0–34.0)
MCHC: 34.4 g/dL (ref 30.0–36.0)
MCV: 83.5 fL (ref 78.0–100.0)
PLATELETS: 190 10*3/uL (ref 150–400)
RBC: 3.27 MIL/uL — AB (ref 3.87–5.11)
RDW: 13.7 % (ref 11.5–15.5)
WBC: 9 10*3/uL (ref 4.0–10.5)

## 2014-03-04 NOTE — Progress Notes (Signed)
OT Cancellation Note  Patient Details Name: Jacqueline Huber MRN: [id number] DOB: Nov 22, 1920   Cancelled Treatment:    Reason Eval/Treat Not Completed: Other (comment). Orders received, pt will d/c to SNF on Monday 03/06/14. No acute OT needs identified at this time, will defer any further OT intervention to SNF  Mosetta Putt 03/04/2014, 12:40 PM

## 2014-03-04 NOTE — Evaluation (Addendum)
Physical Therapy Evaluation Patient Details Name: Jacqueline Huber MRN: [id number] DOB: 03/18/1921 Today's Date: 03/04/2014   History of Present Illness  Pt admitted after fall and sustained L hip fx, s/p ORIF  Clinical Impression  Pt tolerated OOB and ambulated x 10', appears to place > 50% so limited  Ambulation. Pt will benefit from PT to address problems listed.   Follow Up Recommendations SNF    Equipment Recommendations  None recommended by PT    Recommendations for Other Services       Precautions / Restrictions Precautions Precautions: Fall Restrictions Weight Bearing Restrictions: Yes LLE Weight Bearing: Partial weight bearing LLE Partial Weight Bearing Percentage or Pounds: 50      Mobility  Bed Mobility Overal bed mobility: Needs Assistance Bed Mobility: Supine to Sit     Supine to sit: Mod assist;HOB elevated     General bed mobility comments: extra time to move to edge, support of L leg provided, bed pad used  for  just squaring pt up.  Transfers Overall transfer level: Needs assistance Equipment used: Rolling walker (2 wheeled) Transfers: Sit to/from Stand Sit to Stand: +2 physical assistance;+2 safety/equipment;Max assist;From elevated surface         General transfer comment: cues for WB 50%. ?if pt understands.  Ambulation/Gait Ambulation/Gait assistance: +2 physical assistance;+2 safety/equipment;Mod assist Ambulation Distance (Feet): 10 Feet Assistive device: Rolling walker (2 wheeled)       General Gait Details: cues for sequence, assist to advance L leg  for several steps.  Stairs            Wheelchair Mobility    Modified Rankin (Stroke Patients Only)       Balance                                             Pertinent Vitals/Pain Reports L hip is sore.    Home Living Family/patient expects to be discharged to:: Skilled nursing facility Living Arrangements: Other relatives                     Prior Function                 Hand Dominance        Extremity/Trunk Assessment   Upper Extremity Assessment: Overall WFL for tasks assessed           Lower Extremity Assessment: LLE deficits/detail   LLE Deficits / Details: L Hip positions in ER, tolerated rotation to neutral/     Communication      Cognition Arousal/Alertness: Awake/alert Behavior During Therapy: WFL for tasks assessed/performed Overall Cognitive Status: History of cognitive impairments - at baseline                      General Comments      Exercises        Assessment/Plan    PT Assessment Patient needs continued PT services  PT Diagnosis Difficulty walking;Generalized weakness   PT Problem List Decreased strength;Decreased range of motion;Decreased activity tolerance;Decreased mobility;Decreased balance;Decreased safety awareness;Decreased knowledge of precautions;Decreased knowledge of use of DME;Decreased cognition  PT Treatment Interventions DME instruction;Gait training;Functional mobility training;Therapeutic activities;Therapeutic exercise;Patient/family education   PT Goals (Current goals can be found in the Care Plan section) Acute Rehab PT Goals Patient Stated Goal: Per daughter, to go to rehab PT Goal Formulation: With  patient/family Time For Goal Achievement: 03/18/14 Potential to Achieve Goals: Good    Frequency Min 3X/week   Barriers to discharge        Co-evaluation               End of Session Equipment Utilized During Treatment: Gait belt Activity Tolerance: Patient tolerated treatment well Patient left: in chair;with call bell/phone within reach;with family/visitor present Nurse Communication: Mobility status         Time: 8786-7672 PT Time Calculation (min): 43 min   Charges:   PT Evaluation $Initial PT Evaluation Tier I: 1 Procedure PT Treatments $Gait Training: 23-37 mins   PT G Codes:          Claretha Cooper 03/04/2014, 11:51 AM Tresa Endo PT [phone]

## 2014-03-04 NOTE — Progress Notes (Signed)
Decided to leave Foley in place until more sure of PT eval time this am. Oncoming RN will d/c at that time.

## 2014-03-04 NOTE — Progress Notes (Signed)
PATIENT ID: Jacqueline Huber   1 Day Post-Op Procedure(s) (LRB): INTRAMEDULLARY (IM) NAIL INTERTROCHANTRIC (Left)  Subjective: Pain mostly controlled with tylenol  Objective:  Filed Vitals:   03/04/14 0519  BP: 113/69  Pulse: 73  Temp: 98.3 F (36.8 C)  Resp: 16     Awake and alert L hip dressing with sml spotting NVID  Labs:   Recent Labs  03/02/14 1831 03/03/14 0402 03/04/14 0353  HGB 10.4* 8.1* 9.4*   Recent Labs  03/03/14 0402 03/04/14 0353  WBC 7.8 9.0  RBC 2.82*  2.80* 3.27*  HCT 23.3* 27.3*  PLT 240 190   Recent Labs  03/03/14 0402 03/04/14 0353  NA 136* 140  K 4.5 4.4  CL 101 104  CO2 23 26  BUN 19 15  CREATININE 0.80 0.93  GLUCOSE 125* 98  CALCIUM 10.2 9.5    Assessment and Plan:S/p  ORIF L hip fracture WBAT Will follow  VTE proph: ECASA and SCDs

## 2014-03-04 NOTE — Progress Notes (Signed)
[facility] PROGRESS NOTE  Jacqueline Huber WFU:[id number] DOB: Apr 09, 1921 DOA: 03/02/2014 PCP: [name], MD  Assessment/Plan: Brief narrative:  80 y.o. female who was brought to the ED after suffering a mechanical fall in her home onto her left side. She lives independently and is able to perform all of her ADLs. She has lost considerable weight over the past 2 years due to poor appetite since the loss of her husband. In the ED, she was evaluated and found to have a minimally displaced intertrochanteric fracture of the Left hip. Orthopedic Surgeon Dr Mayer Camel was consulted.   Active Problems:  Hip fracture, left  - pod # 1 INTRAMEDULLARY (IM) NAIL INTERTROCHANTRIC (Left - Patient currently obtaining PT: PT recommending SNF - pt willing to go to SNF when medically ready   Unspecified essential hypertension  - reasonably well controlled  Anemia of chronic disease  - no signs of active bleeding  - Stable currently  Hypercalcemia  - from dehydration, started on IVF and now resolved  - provide IVF and repeat BMP in AM   Protein-calorie malnutrition, severe  - RD on board - Pt meets criteria for severe MALNUTRITION in the context of chronic illness as evidenced by severe muscle wasting and subcutaneous fat loss throughout body. - family requesting megace: I'd rather avoid this given that this medication has side effect of thrombosis/thromboembolism - Continue with supplementation recommended by RD  Code Status: full Family Communication: Discussed with patient and family members at bedside Disposition Plan: Pending further improvement in condition.   Consultants:  Ortho  RD  Procedures:  As listed above  Antibiotics:  None  HPI/Subjective: Pt has no new complaints. Family requesting medication for appetite stimulation.  Reportedly since her husband died her appetite has been poor.  Objective: Filed Vitals:   03/04/14 1529  BP: 126/75  Pulse: 75  Temp: 99  F (37.2 C)  Resp: 16    Intake/Output Summary (Last 24 hours) at 03/04/14 1614 Last data filed at 03/04/14 1530  Gross per 24 hour  Intake 1777.5 ml  Output    900 ml  Net  877.5 ml   Filed Weights   03/02/14 2215  Weight: 47 kg (103 lb 9.9 oz)    Exam:   General:  Pt in NAD, alert and awake  Cardiovascular: no cyanosis  Respiratory: no increased WOB, no audible wheezes  Abdomen: soft, NT, ND  Musculoskeletal: no clubbing   Data Reviewed: Basic Metabolic Panel:  Recent Labs Lab 03/02/14 1831 03/03/14 0402 03/04/14 0353  NA 138 136* 140  K 3.4* 4.5 4.4  CL 96 101 104  CO2 24 23 26   GLUCOSE 150* 125* 98  BUN 17 19 15   CREATININE 0.83 0.80 0.93  CALCIUM 11.4* 10.2 9.5   Liver Function Tests: No results found for this basename: AST, ALT, ALKPHOS, BILITOT, PROT, ALBUMIN,  in the last 168 hours No results found for this basename: LIPASE, AMYLASE,  in the last 168 hours No results found for this basename: AMMONIA,  in the last 168 hours CBC:  Recent Labs Lab 03/02/14 1831 03/03/14 0402 03/04/14 0353  WBC 9.5 7.8 9.0  NEUTROABS 8.5*  --   --   HGB 10.4* 8.1* 9.4*  HCT 30.1* 23.3* 27.3*  MCV 83.1 82.6 83.5  PLT 293 240 190   Cardiac Enzymes: No results found for this basename: CKTOTAL, CKMB, CKMBINDEX, TROPONINI,  in the last 168 hours BNP (last 3 results) No results found for this basename: PROBNP,  in the last 8760 hours CBG: No results found for this basename: GLUCAP,  in the last 168 hours  Recent Results (from the past 240 hour(s))  SURGICAL PCR SCREEN     Status: None   Collection Time    03/03/14  9:50 AM      Result Value Ref Range Status   MRSA, PCR NEGATIVE  NEGATIVE Final   Staphylococcus aureus NEGATIVE  NEGATIVE Final   Comment:            The Xpert SA Assay (FDA     approved for NASAL specimens     in patients over 73 years of age),     is one component of     a comprehensive surveillance     program.  Test performance has      been validated by [employer] for patients greater     than or equal to 56 year old.     It is not intended     to diagnose infection nor to     guide or monitor treatment.     Studies: Dg Hip Complete Left  03/02/2014   CLINICAL DATA:  History of fall complaining of left hip pain.  EXAM: LEFT HIP - COMPLETE 2+ VIEW  COMPARISON:  No priors.  FINDINGS: Three views of the bony pelvis and the left hip demonstrate a very subtle minimally displaced left side intertrochanteric hip fracture. Femoral head remains located. Diffuse osteopenia. Visualized portions of the bony pelvis and right proximal femur appear intact. Coarse calcification in the left hemipelvis likely represents a small calcified fibroid.  IMPRESSION: 1. Subtle minimally displaced intertrochanteric fracture of the left hip.   Electronically Signed   By: Vinnie Langton M.D.   On: 03/02/2014 18:32   Dg Hip Operative Left  03/03/2014   CLINICAL DATA:  Open reduction internal fixation for intertrochanteric femur fracture  EXAM: OPERATIVE LEFT HIP  COMPARISON:  March 02, 2014  FINDINGS: Frontal and lateral views were obtained. There is screw and plate fixation through an intertrochanteric femur fracture on the left. Alignment at the fracture site is anatomic. The tip of the screw is in the proximal femoral head. No dislocation.  IMPRESSION: Alignment at fracture site anatomic.  No dislocation.   Electronically Signed   By: Lowella Grip M.D.   On: 03/03/2014 15:54    Scheduled Meds: . aspirin EC  325 mg Oral Q breakfast   Continuous Infusions: . dextrose 5 % and 0.45 % NaCl with KCl 20 mEq/L 50 mL/hr at 03/03/14 1930     Time spent: > 35 minutes    [facility] Pager [phone] . If 7PM-7AM, please contact night-coverage at [email or web address], password [facility] 03/04/2014, 4:14 PM  LOS: 2 days

## 2014-03-05 DIAGNOSIS — E43 Unspecified severe protein-calorie malnutrition: Secondary | ICD-10-CM

## 2014-03-05 LAB — TYPE AND SCREEN
ABO/RH(D): B POS
Antibody Screen: NEGATIVE
UNIT DIVISION: 0
Unit division: 0
Unit division: 0
Unit division: 0

## 2014-03-05 LAB — CBC
HEMATOCRIT: 27.3 % — AB (ref 36.0–46.0)
HEMOGLOBIN: 9.6 g/dL — AB (ref 12.0–15.0)
MCH: 29.1 pg (ref 26.0–34.0)
MCHC: 35.2 g/dL (ref 30.0–36.0)
MCV: 82.7 fL (ref 78.0–100.0)
Platelets: 203 10*3/uL (ref 150–400)
RBC: 3.3 MIL/uL — ABNORMAL LOW (ref 3.87–5.11)
RDW: 13.7 % (ref 11.5–15.5)
WBC: 10.4 10*3/uL (ref 4.0–10.5)

## 2014-03-05 MED ORDER — HYDRALAZINE HCL 25 MG PO TABS
25.0000 mg | ORAL_TABLET | Freq: Once | ORAL | Status: AC
  Administered 2014-03-05: 25 mg via ORAL
  Filled 2014-03-05: qty 1

## 2014-03-05 NOTE — Progress Notes (Signed)
   PATIENT ID: Jacqueline Huber   2 Days Post-Op Procedure(s) (LRB): INTRAMEDULLARY (IM) NAIL INTERTROCHANTRIC (Left)  Subjective: Reports feeling good today. No pain at rest. No other complaints or concerns.  Objective:  Filed Vitals:   03/05/14 0627  BP: 143/84  Pulse:   Temp:   Resp:      Awake, alert, orientated L hip dressing with small blood Wiggles toes, distally NVI  Labs:   Recent Labs  03/02/14 1831 03/03/14 0402 03/04/14 0353 03/05/14 0419  HGB 10.4* 8.1* 9.4* 9.6*   Recent Labs  03/04/14 0353 03/05/14 0419  WBC 9.0 10.4  RBC 3.27* 3.30*  HCT 27.3* 27.3*  PLT 190 203   Recent Labs  03/03/14 0402 03/04/14 0353  NA 136* 140  K 4.5 4.4  CL 101 104  CO2 23 26  BUN 19 15  CREATININE 0.80 0.93  GLUCOSE 125* 98  CALCIUM 10.2 9.5    Assessment and Plan: 2 days s/p left hip IM nail WBAT Up with PT today, OT deferred to SNF Plan for SNF tomorrow D/c per primary team when medically stable Continue pain rx, minimizing narcotics, controlled with tylenol  SCDs, ASA 325mg  BID for DVT prophylaxis

## 2014-03-05 NOTE — Progress Notes (Signed)
Physical Therapy Treatment Patient Details Name: Jacqueline Huber MRN: [id number] DOB: 11-Nov-1920 Today's Date: 03/05/2014    History of Present Illness L Hip ORIF    PT Comments    Pt continues to have LLE positioned into external rotation. Propped with roll to facilitate neutral, encouraged active internal rotation. Plans for SNF.  Follow Up Recommendations  SNF     Equipment Recommendations  None recommended by PT    Recommendations for Other Services       Precautions / Restrictions Precautions Precautions: Fall Restrictions LLE Weight Bearing: Partial weight bearing    Mobility  Bed Mobility Overal bed mobility: Needs Assistance Bed Mobility: Supine to Sit     Supine to sit: Mod assist;HOB elevated     General bed mobility comments: extra time to move to edge, support of L leg provided, bed pad used  for  just squaring pt up.  Transfers Overall transfer level: Needs assistance Equipment used: Rolling walker (2 wheeled) Transfers: Sit to/from [facility] Sit to Stand: +2 physical assistance;+2 safety/equipment;From elevated surface;Mod assist Stand pivot transfers: +2 physical assistance;+2 safety/equipment;Max assist       General transfer comment: cues for WB 50%. ?if pt understands. decreased  weight bear on LLE/ decr support  Ambulation/Gait                 Stairs            Wheelchair Mobility    Modified Rankin (Stroke Patients Only)       Balance                                    Cognition Arousal/Alertness: Awake/alert                          Exercises General Exercises - Lower Extremity Heel Slides: AAROM;Left;10 reps;Supine Other Exercises Other Exercises: internal rotation  with assist x 10    General Comments        Pertinent Vitals/Pain Does not c/o pain. Very somnolent.    Home Living Family/patient expects to be discharged to:: Skilled nursing facility                     Prior Function            PT Goals (current goals can now be found in the care plan section) Progress towards PT goals: Progressing toward goals    Frequency  Min 3X/week    PT Plan Current plan remains appropriate    Co-evaluation             End of Session Equipment Utilized During Treatment: Gait belt Activity Tolerance: Patient tolerated treatment well Patient left: in chair;with call bell/phone within reach;with family/visitor present     Time: 1349-1410 PT Time Calculation (min): 21 min  Charges:  $Therapeutic Activity: 8-22 mins                    G Codes:      Claretha Cooper 03/05/2014, 2:57 PM Tresa Endo PT [phone]

## 2014-03-05 NOTE — Progress Notes (Signed)
CARE MANAGEMENT NOTE 03/05/2014  Patient:  TANEESHA, EDGIN   Account Number:  [id number]  Date Initiated:  03/04/2014  Documentation initiated by:  [facility]  Subjective/Objective Assessment:   INTRAMEDULLARY (IM) NAIL INTERTROCHANTRIC     Action/Plan:   [facility]Star Age POA   Anticipated DC Date:  03/05/2014   Anticipated DC Plan:  SKILLED NURSING FACILITY  In-house referral  Clinical Social Worker      DC Planning Services  CM consult      Choice offered to / List presented to:             Status of service:  Completed, signed off Medicare Important Message given?  YES (If response is "NO", the following Medicare IM given date fields will be blank) Date Medicare IM given:  03/02/2014 Date Additional Medicare IM given:    Discharge Disposition:  [place]  Per UR Regulation:    If discussed at Long Length of Stay Meetings, dates discussed:    Comments:  03/05/2014 1000 NCM spoke to dtr and they want SNF-rehab at [facility]. NCM spoke to CSW and they are working on SNF placement. Jonnie Finner RN CCM Case Mgmt phone [phone]

## 2014-03-05 NOTE — Progress Notes (Signed)
Patient ID: Jacqueline Huber, female   DOB: January 26, 1921, 80 y.o.   MRN: [id number]  [facility] PROGRESS NOTE  Jacqueline Huber [id number] DOB: 06/11/1921 DOA: 03/02/2014 PCP: Jacqueline Neer, MD  Assessment/Plan:  Brief narrative:  80 y.o. female who was brought to the ED after suffering a mechanical fall in her home onto her left side. She lives independently and is able to perform all of her ADLs. She has lost considerable weight over the past 2 years due to poor appetite since the loss of her husband. In the ED, she was evaluated and found to have a minimally displaced intertrochanteric fracture of the Left hip. Orthopedic Surgeon Dr Jacqueline Huber was consulted.   Active Problems:  Hip fracture, left  - pod # 2 INTRAMEDULLARY (IM) NAIL INTERTROCHANTRIC (Left  - Patient currently obtaining PT: PT recommending SNF  - pt willing to go to SNF when medically ready, plan for [facility] place in AM  Unspecified essential hypertension  - reasonably well controlled  Anemia of chronic disease  - no signs of active bleeding  - Stable currently  Hypercalcemia  - from dehydration, started on IVF and now resolved  - provide IVF and repeat BMP in AM  Protein-calorie malnutrition, severe  - RD on board  - Pt meets criteria for severe MALNUTRITION in the context of chronic illness as evidenced by severe muscle wasting and subcutaneous fat loss throughout body.  - Continue with supplementation recommended by RD   Code Status: full  Family Communication: Discussed with patient and family members at bedside  Disposition Plan: SNF in AM  Consultants:  Ortho  RD Procedures:  As listed above Antibiotics:  None  HPI/Subjective: No events overnight.   Objective: Filed Vitals:   03/04/14 1600 03/04/14 2057 03/05/14 0500 03/05/14 0627  BP:  113/64 168/111 143/84  Pulse:  87 112   Temp:  98.4 F (36.9 C) 99 F (37.2 C)   TempSrc:  Oral Oral   Resp: 16 16 16    Height:      Weight:      SpO2:  100% 100% 100%     Intake/Output Summary (Last 24 hours) at 03/05/14 1150 Last data filed at 03/05/14 1035  Gross per 24 hour  Intake    950 ml  Output    150 ml  Net    800 ml    Exam:   General:  Pt is alert, follows commands appropriately, not in acute distress  Cardiovascular: Regular rate and rhythm, S1/S2, no murmurs, no rubs, no gallops  Respiratory: Clear to auscultation bilaterally, no wheezing, no crackles, no rhonchi  Abdomen: Soft, non tender, non distended, bowel sounds present, no guarding  Data Reviewed: Basic Metabolic Panel:  Recent Labs Lab 03/02/14 1831 03/03/14 0402 03/04/14 0353  NA 138 136* 140  K 3.4* 4.5 4.4  CL 96 101 104  CO2 24 23 26   GLUCOSE 150* 125* 98  BUN 17 19 15   CREATININE 0.83 0.80 0.93  CALCIUM 11.4* 10.2 9.5   CBC:  Recent Labs Lab 03/02/14 1831 03/03/14 0402 03/04/14 0353 03/05/14 0419  WBC 9.5 7.8 9.0 10.4  NEUTROABS 8.5*  --   --   --   HGB 10.4* 8.1* 9.4* 9.6*  HCT 30.1* 23.3* 27.3* 27.3*  MCV 83.1 82.6 83.5 82.7  PLT 293 240 190 203    No results found for this basename: GLUCAP,  in the last 168 hours  Recent Results (from the past 240 hour(s))  SURGICAL PCR SCREEN  Status: None   Collection Time    03/03/14  9:50 AM      Result Value Ref Range Status   MRSA, PCR NEGATIVE  NEGATIVE Final   Staphylococcus aureus NEGATIVE  NEGATIVE Final   Comment:            The Xpert SA Assay (FDA     approved for NASAL specimens     in patients over 56 years of age),     is one component of     a comprehensive surveillance     program.  Test performance has     been validated by [employer] for patients greater     than or equal to 95 year old.     It is not intended     to diagnose infection nor to     guide or monitor treatment.     Scheduled Meds: . aspirin EC  325 mg Oral Q breakfast   Continuous Infusions: . dextrose 5 % and 0.45 % NaCl with KCl 20 mEq/L Stopped (03/04/14 2100)   Jacqueline Blaze,  MD  [facility] Pager [phone]  If 7PM-7AM, please contact night-coverage [email or web address] Password [email or web address] 03/05/2014, 11:50 AM   LOS: 3 days

## 2014-03-06 LAB — CBC
HCT: 25.4 % — ABNORMAL LOW (ref 36.0–46.0)
HEMOGLOBIN: 8.5 g/dL — AB (ref 12.0–15.0)
MCH: 28.4 pg (ref 26.0–34.0)
MCHC: 33.5 g/dL (ref 30.0–36.0)
MCV: 84.9 fL (ref 78.0–100.0)
PLATELETS: 216 10*3/uL (ref 150–400)
RBC: 2.99 MIL/uL — ABNORMAL LOW (ref 3.87–5.11)
RDW: 14 % (ref 11.5–15.5)
WBC: 9.2 10*3/uL (ref 4.0–10.5)

## 2014-03-06 LAB — BASIC METABOLIC PANEL
BUN: 21 mg/dL (ref 6–23)
CHLORIDE: 102 meq/L (ref 96–112)
CO2: 27 meq/L (ref 19–32)
Calcium: 9.8 mg/dL (ref 8.4–10.5)
Creatinine, Ser: 0.8 mg/dL (ref 0.50–1.10)
GFR calc Af Amer: 73 mL/min — ABNORMAL LOW (ref >90)
GFR calc non Af Amer: 63 mL/min — ABNORMAL LOW (ref >90)
GLUCOSE: 104 mg/dL — AB (ref 70–99)
Potassium: 3.9 mEq/L (ref 3.7–5.3)
SODIUM: 139 meq/L (ref 137–147)

## 2014-03-06 MED ORDER — ASPIRIN 325 MG PO TABS
325.0000 mg | ORAL_TABLET | Freq: Every day | ORAL | Status: DC
  Administered 2014-03-06: 325 mg via ORAL
  Filled 2014-03-06 (×3): qty 1

## 2014-03-06 MED ORDER — HYDROCODONE-ACETAMINOPHEN 5-325 MG PO TABS: 1.0000 | ORAL_TABLET | Freq: Four times a day (QID) | ORAL | Status: DC | PRN

## 2014-03-06 MED ORDER — ASPIRIN EC 325 MG PO TBEC: 325.0000 mg | DELAYED_RELEASE_TABLET | Freq: Two times a day (BID) | ORAL | Status: AC

## 2014-03-06 MED ORDER — TIZANIDINE HCL 2 MG PO CAPS: 2.0000 mg | ORAL_CAPSULE | Freq: Three times a day (TID) | ORAL | Status: DC

## 2014-03-06 NOTE — Discharge Summary (Signed)
Physician Discharge Summary  Jacqueline Huber TKW:[id number] DOB: [birth year] DOA: 03/02/2014  PCP: Jacqueline Neer, MD  Admit date: 03/02/2014 Discharge date: 03/06/2014  Recommendations for Outpatient Follow-up:  1. Pt will need to follow up with PCP in 2-3 weeks post discharge 2. Please obtain BMP to evaluate electrolytes and kidney function 3. Please also check CBC to evaluate Hg and Hct levels  Discharge Diagnoses: Hip fracture  Active Problems:   Hip fracture   Closed left hip fracture   Unspecified essential hypertension   Anemia   Hypercalcemia   Protein-calorie malnutrition, severe   Discharge Condition: Stable  Diet recommendation: Heart healthy diet discussed in details   Assessment/Plan:  Brief narrative:  80 y.o. female who was brought to the ED after suffering a mechanical fall in her home onto her left side. She lives independently and is able to perform all of her ADLs. She has lost considerable weight over the past 2 years due to poor appetite since the loss of her husband. In the ED, she was evaluated and found to have a minimally displaced intertrochanteric fracture of the Left hip. Orthopedic Surgeon Dr Mayer Camel was consulted.   Active Problems:  Hip fracture, left  - pod # 3 INTRAMEDULLARY (IM) NAIL INTERTROCHANTRIC (Left) - Patient currently obtaining PT: PT recommending SNF and will plan on d/c today Unspecified essential hypertension  - reasonably well controlled  Anemia of chronic disease  - drop in Hg post op related, no signs of active bleeding  Hypercalcemia  - from dehydration, started on IVF and now resolved  - provided IVF and this is now resolved  Protein-calorie malnutrition, severe  - RD on board  - Pt meets criteria for severe MALNUTRITION in the context of chronic illness as evidenced by severe muscle wasting and subcutaneous fat loss throughout body.  - Continue with supplementation recommended by RD   Code Status: full  Family Communication:  Discussed with patient and family members at bedside  Disposition Plan: SNF   Consultants:  Ortho  RD Procedures:  As listed above Antibiotics:  None  Discharge Exam: Filed Vitals:   03/06/14 0652  BP: 118/77  Pulse: 86  Temp: 98.9 F (37.2 C)  Resp: 15   Filed Vitals:   03/05/14 0627 03/05/14 1459 03/05/14 2144 03/06/14 0652  BP: 143/84 107/48 109/64 118/77  Pulse:  64 84 86  Temp:  98.6 F (37 C) 98.8 F (37.1 C) 98.9 F (37.2 C)  TempSrc:  Oral Oral Oral  Resp:  16 14 15   Height:      Weight:      SpO2:  100% 98% 98%    General: Pt is alert, follows commands appropriately, not in acute distress Cardiovascular: Regular rate and rhythm, no rubs, no gallops Respiratory: Clear to auscultation bilaterally, no wheezing, no crackles, no rhonchi Abdominal: Soft, non tender, non distended, bowel sounds +, no guarding Extremities: no edema, no cyanosis, pulses palpable bilaterally DP and PT Neuro: Grossly nonfocal  Discharge Instructions  Discharge Orders   Future Orders Complete By Expires   Diet - low sodium heart healthy  As directed    Increase activity slowly  As directed    Partial weight bearing  As directed    Questions:     % Body Weight:  50% with the use  of a walker   Laterality:  left   Extremity:  Lower       Medication List         amLODipine 10 MG  tablet  Commonly known as:  NORVASC  Take 10 mg by mouth daily.     aspirin EC 325 MG tablet  Take 1 tablet (325 mg total) by mouth 2 (two) times daily.     HYDROcodone-acetaminophen 5-325 MG per tablet  Commonly known as:  NORCO  Take 1 tablet by mouth every 6 (six) hours as needed.     moexipril-hydrochlorothiazide 15-25 MG per tablet  Commonly known as:  UNIRETIC  Take 1 tablet by mouth daily.     polyethylene glycol packet  Commonly known as:  MIRALAX / GLYCOLAX  Take 17 g by mouth daily.     tizanidine 2 MG capsule  Commonly known as:  ZANAFLEX  Take 1 capsule (2 mg total) by  mouth 3 (three) times daily.           Follow-up Information   Follow up with [name], MD In 2 weeks.   Specialty:  Orthopedic Surgery   Contact information:   [place] [address] [phone]       Schedule an appointment as soon as possible for a visit with Jacqueline Neer, MD.   Specialty:  Family Medicine   Contact information:   [address] [place] [address] [phone]        The results of significant diagnostics from this hospitalization (including imaging, microbiology, ancillary and laboratory) are listed below for reference.     Microbiology: Recent Results (from the past 240 hour(s))  SURGICAL PCR SCREEN     Status: None   Collection Time    03/03/14  9:50 AM      Result Value Ref Range Status   MRSA, PCR NEGATIVE  NEGATIVE Final   Staphylococcus aureus NEGATIVE  NEGATIVE Final   Comment:            The Xpert SA Assay (FDA     approved for NASAL specimens     in patients over 12 years of age),     is one component of     a comprehensive surveillance     program.  Test performance has     been validated by [employer] for patients greater     than or equal to 75 year old.     It is not intended     to diagnose infection nor to     guide or monitor treatment.     Labs: Basic Metabolic Panel:  Recent Labs Lab 03/02/14 1831 03/03/14 0402 03/04/14 0353 03/06/14 0355  NA 138 136* 140 139  K 3.4* 4.5 4.4 3.9  CL 96 101 104 102  CO2 24 23 26 27   GLUCOSE 150* 125* 98 104*  BUN 17 19 15 21   CREATININE 0.83 0.80 0.93 0.80  CALCIUM 11.4* 10.2 9.5 9.8   Liver Function Tests: No results found for this basename: AST, ALT, ALKPHOS, BILITOT, PROT, ALBUMIN,  in the last 168 hours No results found for this basename: LIPASE, AMYLASE,  in the last 168 hours No results found for this basename: AMMONIA,  in the last 168 hours CBC:  Recent Labs Lab 03/02/14 1831 03/03/14 0402 03/04/14 0353  03/05/14 0419 03/06/14 0355  WBC 9.5 7.8 9.0 10.4 9.2  NEUTROABS 8.5*  --   --   --   --   HGB 10.4* 8.1* 9.4* 9.6* 8.5*  HCT 30.1* 23.3* 27.3* 27.3* 25.4*  MCV 83.1 82.6 83.5 82.7 84.9  PLT 293 240 190 203  216   Cardiac Enzymes: No results found for this basename: CKTOTAL, CKMB, CKMBINDEX, TROPONINI,  in the last 168 hours BNP: BNP (last 3 results) No results found for this basename: PROBNP,  in the last 8760 hours CBG: No results found for this basename: GLUCAP,  in the last 168 hours   SIGNED: Time coordinating discharge: Over 30 minutes  Theodis Blaze, MD  [facility] 03/06/2014, 10:36 AM Pager [phone]  If 7PM-7AM, please contact night-coverage [email or web address] Password [email or web address]

## 2014-03-06 NOTE — Discharge Instructions (Signed)
Hip Fracture (Upper Femoral Fracture) °You have a hip fracture (break in bone). This is a fracture of the upper part of the big bone (femur, thigh bone) between your hip and knee. If your caregiver feels it is a stable fracture, occasionally it can be treated without surgery. Usually these fractures are unstable. This means that the bones will not heal properly without surgery. Surgery is necessary to hold the bones together in a good position where they will heal well. °DIAGNOSIS °A physical exam can determine if a fracture has occurred. X-ray studies are needed to see what type of fracture is present and to look for other injuries. These studies will help your caregiver determine what the best treatment is for you. If there is more than one option, your caregiver can give you the information needed to help you decide on the treatment. °TREATMENT  °The treatment for an unstable fracture is usually surgery. This means using a screw, nail, or rod to hold the bones in place.  °RISKS AND COMPLICATIONS °All surgery is associated with risks. Sometimes the implant may fail. Other complications of surgery include infection or the bones not healing properly. Sometimes the fracture may damage the blood supply to the head of the femur. That portion of bone may die (osteonecrosis or avascular necrosis). Sometimes to avoid this complication, an implant is used which just replaces the ball of the femur (hemi-arthroplasty or prosthetic replacement). Some of the other risks are: °· Excessive bleeding. °· Infection. °· Dislocation if a hemi-arthroplasty or a total hip was inserted. °· Failure to heal properly resulting in an unstable hip. °· Stiffness of hip following repair. °· On occasion, blood may have to be replaced before or during the procedure °LET YOUR CAREGIVERS KNOW ABOUT: °· Allergies. °· Medications taken including herbs, eye drops, over the counter medications, and creams. °· Use of steroids (by mouth or  creams). °· History of bleeding or blood problems. °· History of serious infection. °· Previous problems with anesthetics or novocaine. °· Possibility of pregnancy, if this applies. °· History of blood clots (thrombophlebitis). °· Previous surgery. °· Other health problems. °BEFORE THE PROCEDURE °Before surgery, an IV (intravenous line connected to your vein) may be started. You will be given an anesthetic (medications and gas to make you sleep) or given medications in your back to make you numb from the waist down (spinal anesthetic). °AFTER THE PROCEDURE °After surgery, you will be taken to the recovery area where a nurse will watch your progress. You may have a catheter (a long, narrow, hollow tube) in your bladder that helps you pass your water. Once you're awake, stable, and taking fluids well, you will be returned to your room. You will receive physical therapy and other care until you are doing well and your caregiver feels it is safe for you to be transferred either to home or to an extended care facility. Your activity level will change as your caregiver determines what is best for you. °· You may resume normal diet and activities as directed or allowed. °· Change dressings if necessary or as directed. °· Only take over-the-counter or prescription medicines for pain, discomfort, or fever as directed by your caregiver. °· You may be placed on blood thinners for 4-6 weeks to prevent blood clots. °SEEK IMMEDIATE MEDICAL CARE IF: °· There is swelling of your calf or leg. °· You have shortness of breath or chest pain. °· There is redness, swelling, or increasing pain in the wound. °· There is   pus coming from wound. °· You have an unexplained oral temperature above 102° F (38.9° C). °· There is a foul (bad) smell coming from the wound or dressing. °· There is a breaking open of the wound (edges not staying together) after sutures or staples have been removed. °· There is a marked increase in pain or shortening of  the leg. °· You have severe pain anywhere in the leg. °· There is any change in color or temperature of your leg below the injury. °MAKE SURE YOU:  °· Understand these instructions. °· Will watch your condition. °· Will get help right away if you are not doing well or get worse. °Document Released: 10/20/2005 Document Revised: 01/12/2012 Document Reviewed: 06/01/2013 °ExitCare® Patient Information ©2014 [employer]. ° °

## 2014-03-06 NOTE — Progress Notes (Addendum)
PATIENT ID: Jacqueline Huber  MRN: [id number]  DOB/AGE:  11/18/1920 / 80 y.o.  3 Days Post-Op Procedure(s) (LRB): INTRAMEDULLARY (IM) NAIL INTERTROCHANTRIC (Left)    PROGRESS NOTE Subjective: Patient is alert, oriented,no Nausea, no Vomiting, yes passing gas, no Bowel Movement. Taking PO well. Denies SOB, Chest or Calf Pain. Using Incentive Spirometer, PAS in place. Ambulate 50% Patient reports pain as moderate  .    Objective: Vital signs in last 24 hours: Filed Vitals:   03/05/14 0627 03/05/14 1459 03/05/14 2144 03/06/14 0652  BP: 143/84 107/48 109/64 118/77  Pulse:  64 84 86  Temp:  98.6 F (37 C) 98.8 F (37.1 C) 98.9 F (37.2 C)  TempSrc:  Oral Oral Oral  Resp:  16 14 15   Height:      Weight:      SpO2:  100% 98% 98%      Intake/Output from previous day: I/O last 3 completed shifts: In: 53 [P.O.:840; I.V.:20] Out: 150 [Urine:150]   Intake/Output this shift:     LABORATORY DATA:  Recent Labs  03/04/14 0353 03/05/14 0419 03/06/14 0355  WBC 9.0 10.4 9.2  HGB 9.4* 9.6* 8.5*  HCT 27.3* 27.3* 25.4*  PLT 190 203 216  NA 140  --  139  K 4.4  --  3.9  CL 104  --  102  CO2 26  --  27  BUN 15  --  21  CREATININE 0.93  --  0.80  GLUCOSE 98  --  104*  CALCIUM 9.5  --  9.8    Examination: Neurologically intact Neurovascular intact Sensation intact distally Intact pulses distally Dorsiflexion/Plantar flexion intact Incision: scant drainage No cellulitis present Compartment soft} XR AP&Lat of hip shows well placed\fixed THA  Assessment:   3 Days Post-Op Procedure(s) (LRB): INTRAMEDULLARY (IM) NAIL INTERTROCHANTRIC (Left) ADDITIONAL DIAGNOSIS:  Acute Blood Loss Anemia  Plan: PT/OT 50 %, THA  posterior precautions  DVT Prophylaxis: SCDx72 hrs, ASA 325 mg BID x 2 weeks  DISCHARGE PLAN: Skilled Nursing Facility/Rehab when cleared by medicine and bed available  DISCHARGE NEEDS: HHPT, HHRN, Walker and 3-in-1 comode seat

## 2014-03-06 NOTE — Progress Notes (Signed)
Clinical Social Work Department CLINICAL SOCIAL WORK PLACEMENT NOTE 03/06/2014  Patient:  Jacqueline Huber, Jacqueline Huber  Account Number:  [id number] Admit date:  03/02/2014  Clinical Social Worker:  Werner Lean, LCSW  Date/time:  03/03/2014 03:22 PM  Clinical Social Work is seeking post-discharge placement for this patient at the following level of care:   SKILLED NURSING   (*CSW will update this form in Epic as items are completed)   03/03/2014  Patient/family provided with [place] Department of Clinical Social Work's list of facilities offering this level of care within the geographic area requested by the patient (or if unable, by the patient's family).  03/03/2014  Patient/family informed of their freedom to choose among providers that offer the needed level of care, that participate in Medicare, Medicaid or managed care program needed by the patient, have an available bed and are willing to accept the patient.    Patient/family informed of [facility]' ownership interest in [facility], as well as of the fact that they are under no obligation to receive care at this facility.  PASARR submitted to EDS on 03/02/2014 PASARR number received from EDS on 03/02/2014  FL2 transmitted to all facilities in geographic area requested by pt/family on  03/03/2014 FL2 transmitted to all facilities within larger geographic area on   Patient informed that his/her managed care company has contracts with or will negotiate with  certain facilities, including the following:     Patient/family informed of bed offers received:  03/03/2014 Patient chooses bed at [facility] Physician recommends and patient chooses bed at    Patient to be transferred to [facility] on  03/06/2014 Patient to be transferred to facility by [facility]  The following physician request were entered in Epic:   Additional Comments:  Werner Lean LCSW [phone]

## 2014-03-07 ENCOUNTER — Encounter (HOSPITAL_COMMUNITY): Payer: Self-pay | Admitting: Orthopedic Surgery

## 2014-03-07 ENCOUNTER — Other Ambulatory Visit: Payer: Self-pay | Admitting: *Deleted

## 2014-03-07 MED ORDER — HYDROCODONE-ACETAMINOPHEN 5-325 MG PO TABS: 1.0000 | ORAL_TABLET | Freq: Four times a day (QID) | ORAL | Status: DC | PRN

## 2014-03-07 NOTE — Telephone Encounter (Signed)
[facility] 

## 2014-03-08 ENCOUNTER — Non-Acute Institutional Stay (SKILLED_NURSING_FACILITY): Payer: Federal, State, Local not specified - PPO | Admitting: Internal Medicine

## 2014-03-08 DIAGNOSIS — D62 Acute posthemorrhagic anemia: Secondary | ICD-10-CM | POA: Insufficient documentation

## 2014-03-08 DIAGNOSIS — K5900 Constipation, unspecified: Secondary | ICD-10-CM | POA: Insufficient documentation

## 2014-03-08 DIAGNOSIS — I10 Essential (primary) hypertension: Secondary | ICD-10-CM

## 2014-03-08 DIAGNOSIS — S72009A Fracture of unspecified part of neck of unspecified femur, initial encounter for closed fracture: Secondary | ICD-10-CM

## 2014-03-08 NOTE — Progress Notes (Signed)
HISTORY & PHYSICAL  DATE: 03/08/2014   FACILITY: [facility]  LEVEL OF CARE: SNF (31)  ALLERGIES:  No Known Allergies  CHIEF COMPLAINT:  Manage left hip fracture, acute blood loss anemia and hypertension  HISTORY OF PRESENT ILLNESS: Patient is [name].  HIP FRACTURE: The patient had a mechanical fall and sustained a femur fracture.  Patient subsequently underwent surgical repair and tolerated the procedure well. Patient is admitted to this facility for short-term rehabilitation. Patient denies hip pain currently. No complications reported from the pain medications currently being used.  ANEMIA: The anemia has been stable. The patient denies fatigue, melena or hematochezia. No complications from the medications currently being used. He lost hemoglobins are 8.5, 9.6 and 9.4.  HTN: Pt 's HTN remains stable.  Denies CP, sob, DOE, pedal edema, headaches, dizziness or visual disturbances.  No complications from the medications currently being used.  Last BP : 100/70.  PAST MEDICAL HISTORY :  Past Medical History  Diagnosis Date  . Hypertension   . Cancer 1960s    throat cancer    PAST SURGICAL HISTORY: Past Surgical History  Procedure Laterality Date  . Throat cancer surgery  1960's  . Intramedullary (im) nail intertrochanteric Left 03/03/2014    Procedure: INTRAMEDULLARY (IM) NAIL INTERTROCHANTRIC;  Surgeon: [name], MD;  Location: [facility];  Service: Orthopedics;  Laterality: Left;    SOCIAL HISTORY:  reports that she has never smoked. She has never used smokeless tobacco. She reports that she does not drink alcohol or use illicit drugs.  FAMILY HISTORY:  Family History  Problem Relation Age of Onset  . Hypertension      CURRENT MEDICATIONS: Reviewed per MAR/see medication list  REVIEW OF SYSTEMS:  See HPI otherwise 14 point ROS is negative.  PHYSICAL EXAMINATION  VS:  See VS section  GENERAL: no acute  distress, thin body habitus EYES: conjunctivae normal, sclerae normal, normal eye lids MOUTH/THROAT: lips without lesions,no lesions in the mouth,tongue is without lesions,uvula elevates in midline NECK: supple, trachea midline, no neck masses, no thyroid tenderness, no thyromegaly LYMPHATICS: no LAN in the neck, no supraclavicular LAN RESPIRATORY: breathing is even & unlabored, BS CTAB CARDIAC: RRR, no murmur,no extra heart sounds, no edema GI:  ABDOMEN: abdomen soft, normal BS, no masses, no tenderness  LIVER/SPLEEN: no hepatomegaly, no splenomegaly MUSCULOSKELETAL: HEAD: normal to inspection & palpation BACK: no kyphosis, scoliosis or spinal processes tenderness EXTREMITIES: LEFT UPPER EXTREMITY: full range of motion, normal strength & tone RIGHT UPPER EXTREMITY:  full range of motion, normal strength & tone LEFT LOWER EXTREMITY:  range of motion not tested due to surgery, normal strength & tone RIGHT LOWER EXTREMITY:  full range of motion, normal strength & tone PSYCHIATRIC: the patient is alert & oriented to person, affect & behavior appropriate  LABS/RADIOLOGY:  Labs reviewed: Basic Metabolic Panel:  Recent Labs  03/03/14 0402 03/04/14 0353 03/06/14 0355  NA 136* 140 139  K 4.5 4.4 3.9  CL 101 104 102  CO2 23 26 27   GLUCOSE 125* 98 104*  BUN 19 15 21   CREATININE 0.80 0.93 0.80  CALCIUM 10.2 9.5 9.8   CBC:  Recent Labs  03/02/14 1831  03/04/14 0353 03/05/14 0419 03/06/14 0355  WBC 9.5  < > 9.0 10.4 9.2  NEUTROABS 8.5*  --   --   --   --   HGB 10.4*  < > 9.4* 9.6* 8.5*  HCT 30.1*  < > 27.3* 27.3* 25.4*  MCV 83.1  < > 83.5 82.7 84.9  PLT 293  < > 190 203 216  < > = values in this interval not displayed.  LEFT HIP - COMPLETE 2+ VIEW   COMPARISON:  No priors.   FINDINGS: Three views of the bony pelvis and the left hip demonstrate a very subtle minimally displaced left side intertrochanteric hip fracture. Femoral head remains located. Diffuse osteopenia.  Visualized portions of the bony pelvis and right proximal femur appear intact. Coarse calcification in the left hemipelvis likely represents a small calcified fibroid.   IMPRESSION: 1. Subtle minimally displaced intertrochanteric fracture of the left hip.     OPERATIVE LEFT HIP   COMPARISON:  March 02, 2014   FINDINGS: Frontal and lateral views were obtained. There is screw and plate fixation through an intertrochanteric femur fracture on the left. Alignment at the fracture site is anatomic. The tip of the screw is in the proximal femoral head. No dislocation.   IMPRESSION: Alignment at fracture site anatomic.  No dislocation.   ASSESSMENT/PLAN:  Left hip fracture-status post ORIF. Continue rehabilitation. Acute blood loss anemia-recheck Hypertension-well-controlled Constipation-continue laxatives Severe protein calorie malnutrition-continue supplements Check CBC  I have reviewed patient's medical records received at admission/from hospitalization.  CPT CODE: 94709  Jacqueline Huber Jacqueline Huber, Jacqueline Huber [phone]

## 2014-03-15 ENCOUNTER — Non-Acute Institutional Stay (SKILLED_NURSING_FACILITY): Payer: Federal, State, Local not specified - PPO | Admitting: Internal Medicine

## 2014-03-15 DIAGNOSIS — K5900 Constipation, unspecified: Secondary | ICD-10-CM

## 2014-03-15 DIAGNOSIS — D649 Anemia, unspecified: Secondary | ICD-10-CM

## 2014-03-16 NOTE — Progress Notes (Signed)
Patient ID: Jacqueline Huber, female   DOB: [birth year], 80 y.o.   MRN: [id number]            PROGRESS NOTE  DATE: 03/15/2014       FACILITY:  [facility]  LEVEL OF CARE: SNF (31)  Acute Visit  CHIEF COMPLAINT:  Manage constipation and anemia of chronic disease.    HISTORY OF PRESENT ILLNESS: I was requested by the staff to assess the patient regarding above problem(s):  CONSTIPATION:  New problem.  Staff report that patient is constipated and last night had to be given a laxative to have a bowel movement.  Patient denies nausea, vomiting, or abdominal pain.    ANEMIA: The anemia has been stable. The patient denies fatigue, melena or hematochezia.  The patient is currently not on iron.    On 03/09/2014:  Hemoglobin 8.9, MCV 85.  On 03/06/2014:  Hemoglobin 8.5.    PAST MEDICAL HISTORY : Reviewed.  No changes/see problem list  CURRENT MEDICATIONS: Reviewed per MAR/see medication list  REVIEW OF SYSTEMS:  GENERAL: no change in appetite, no fatigue, no weight changes, no fever, chills or weakness RESPIRATORY: no cough, SOB, DOE,, wheezing, hemoptysis CARDIAC: no chest pain, edema or palpitations GI: no abdominal pain, diarrhea, constipation, heart burn, nausea or vomiting  PHYSICAL EXAMINATION  GENERAL: no acute distress, thin body habitus RESPIRATORY: breathing is even & unlabored, BS CTAB CARDIAC: RRR, no murmur,no extra heart sounds, no edema GI: abdomen soft, normal BS, no masses, no tenderness, no hepatomegaly, no splenomegaly PSYCHIATRIC: the patient is alert & oriented to person, affect & behavior appropriate  ASSESSMENT/PLAN:  Constipation.  New problem.  Start Colace 100 mg b.i.d.    Anemia of chronic disease.  Hemoglobin improved.     CPT CODE: 79024       Cleston Lautner Y Domnique Vantine, [place] [phone]

## 2014-04-03 ENCOUNTER — Non-Acute Institutional Stay (SKILLED_NURSING_FACILITY): Payer: Federal, State, Local not specified - PPO | Admitting: Internal Medicine

## 2014-04-03 DIAGNOSIS — I10 Essential (primary) hypertension: Secondary | ICD-10-CM

## 2014-04-03 DIAGNOSIS — D62 Acute posthemorrhagic anemia: Secondary | ICD-10-CM

## 2014-04-03 DIAGNOSIS — K5900 Constipation, unspecified: Secondary | ICD-10-CM

## 2014-04-03 DIAGNOSIS — S72009A Fracture of unspecified part of neck of unspecified femur, initial encounter for closed fracture: Secondary | ICD-10-CM

## 2014-04-03 NOTE — Progress Notes (Addendum)
        PROGRESS NOTE  DATE: 04/03/2014   FACILITY: [facility]  LEVEL OF CARE: SNF (31)  Discharge Visit  CHIEF COMPLAINT:  Manage left hip fracture and hypertension  HISTORY OF PRESENT ILLNESS: I was requested by the social worker to perform face-to-face evaluation for discharge:  Patient was admitted to this facility for short-term rehabilitation after the patient's recent hospitalization for left hip fracture.  Patient has completed SNF rehabilitation and therapy has cleared the patient for discharge on 04-04-14.  Reassessment of ongoing problem(s):  HIP FRACTURE: The patient had a mechanical fall and sustained a femur fracture.  Patient subsequently underwent surgical repair and tolerated the procedure well. Patient was admitted to this facility for short-term rehabilitation. Patient denies hip pain currently. No complications reported from the pain medications currently being used.  HTN: Pt 's HTN remains stable.  Denies CP, sob, DOE, pedal edema, headaches, dizziness or visual disturbances.  No complications from the medications currently being used.  Last BP : 128/62.  PAST MEDICAL HISTORY : Reviewed.  No changes/see problem list  CURRENT MEDICATIONS: Reviewed per MAR/see medication list  REVIEW OF SYSTEMS:  GENERAL: no change in appetite, no fatigue, no weight changes, no fever, chills or weakness RESPIRATORY: no cough, SOB, DOE, wheezing, hemoptysis CARDIAC: no chest pain, edema or palpitations GI: no abdominal pain, diarrhea, constipation, heart burn, nausea or vomiting  PHYSICAL EXAMINATION  VS:  See VS section  GENERAL: no acute distress, thin body habitus EYES: conjunctivae normal, sclerae normal, normal eye lids NECK: supple, trachea midline, no neck masses, no thyroid tenderness, no thyromegaly LYMPHATICS: no LAN in the neck, no supraclavicular LAN RESPIRATORY: breathing is even & unlabored, BS CTAB CARDIAC: RRR, no murmur,no extra heart  sounds, no edema GI: abdomen soft, normal BS, no masses, no tenderness, no hepatomegaly, no splenomegaly PSYCHIATRIC: the patient is alert & oriented to person, affect & behavior appropriate  LABS/RADIOLOGY: None  ASSESSMENT/PLAN:  Left hip fracture-continue home physical therapy Hypertension-well controlled Acute blood loss anemia-no further blood loss Constipation-continue Colace Muscle cramps-continue Zanaflex  I have filled out patient's discharge paperwork and written prescriptions.  Patient will receive home health PT, OT, nursing and CNA. DME provided: Wheelchair with cushion, 3-in-1 commode, rolling walker.  Total discharge time: Greater than 30 minutes Discharge time involved coordination of the discharge process with social worker, nursing staff and therapy department. Medical justification for home health services/DME verified.  CPT CODE: 30092  Gayani Y Dasanayaka, [place] [phone]

## 2014-06-10 ENCOUNTER — Emergency Department (HOSPITAL_COMMUNITY)
Admission: EM | Admit: 2014-06-10 | Discharge: 2014-06-10 | Disposition: A | Discharge status: Home / Self Care | Payer: Federal, State, Local not specified - PPO | Attending: Emergency Medicine | Admitting: Emergency Medicine

## 2014-06-10 ENCOUNTER — Encounter (HOSPITAL_COMMUNITY): Payer: Self-pay | Admitting: Emergency Medicine

## 2014-06-10 DIAGNOSIS — R197 Diarrhea, unspecified: Secondary | ICD-10-CM | POA: Diagnosis not present

## 2014-06-10 DIAGNOSIS — I10 Essential (primary) hypertension: Secondary | ICD-10-CM | POA: Insufficient documentation

## 2014-06-10 DIAGNOSIS — Z7982 Long term (current) use of aspirin: Secondary | ICD-10-CM | POA: Insufficient documentation

## 2014-06-10 DIAGNOSIS — Z85819 Personal history of malignant neoplasm of unspecified site of lip, oral cavity, and pharynx: Secondary | ICD-10-CM | POA: Insufficient documentation

## 2014-06-10 DIAGNOSIS — K5900 Constipation, unspecified: Secondary | ICD-10-CM | POA: Insufficient documentation

## 2014-06-10 DIAGNOSIS — K6289 Other specified diseases of anus and rectum: Secondary | ICD-10-CM | POA: Diagnosis present

## 2014-06-10 DIAGNOSIS — K644 Residual hemorrhoidal skin tags: Secondary | ICD-10-CM | POA: Insufficient documentation

## 2014-06-10 DIAGNOSIS — K5641 Fecal impaction: Secondary | ICD-10-CM | POA: Diagnosis not present

## 2014-06-10 DIAGNOSIS — Z79899 Other long term (current) drug therapy: Secondary | ICD-10-CM | POA: Insufficient documentation

## 2014-06-10 MED ORDER — DOCUSATE SODIUM 100 MG PO CAPS: 100.0000 mg | ORAL_CAPSULE | Freq: Every day | ORAL | Status: DC

## 2014-06-10 NOTE — ED Notes (Signed)
Pt. Has a hx of constipation with Hemorrhoids and symptoms began last week.  Pt. 's daughter reports that pt. Moves her bowels but is having rectal pain due to her hemorrhoids.   Denies any bleeding

## 2014-06-10 NOTE — Discharge Instructions (Signed)
Fecal Impaction °A fecal impaction happens when there is a large, firm amount of stool (or feces) that cannot be passed. The impacted stool is usually in the rectum, which is the lowest part of the large bowel. The impacted stool can block the colon and cause significant problems. °CAUSES  °The longer stool stays in the rectum, the harder it gets. Anything that slows down your bowel movements can lead to fecal impaction, such as: °· Constipation. This can be a long-standing (chronic) problem or can happen suddenly (acute). °· Painful conditions of the rectum, such as hemorrhoids or anal fissures. The pain of these conditions can make you try to avoid having bowel movements. °· Narcotic pain-relieving medicines, such as methadone, morphine, or codeine. °· Not drinking enough fluids. °· Inactivity and bed rest over long periods of time. °· Diseases of the brain or nervous system that damage the nerves controlling the muscles of the intestines. °SIGNS AND SYMPTOMS  °· Lack of normal bowel movements or changes in bowel patterns. °· Sense of fullness in the rectum but unable to pass stool. °· Pain or cramps in the abdominal area (often after meals). °· Thin, watery discharge from the rectum. °DIAGNOSIS  °Your health care provider may suspect that you have a fecal impaction based on your symptoms and a physical exam. This will include an exam of your rectum. Sometimes X-rays or lab testing may be needed to confirm the diagnosis and to be sure there are no other problems.  °TREATMENT  °· Initially an impaction can be removed manually. Using a gloved finger, your health care provider can remove hard stool from your rectum. °· Medicine is sometimes needed. A suppository or enema can be given in the rectum to soften the stool, which can stimulate a bowel movement. Medicines can also be given by mouth (orally). °· Though rare, surgery may be needed if the colon has torn (perforated) due to blockage. °HOME CARE INSTRUCTIONS   °· Develop regular bowel habits. This could include getting in the habit of having a bowel movement after your morning cup of coffee or after eating. Be sure to allow yourself enough time on the toilet. °· Maintain a high-fiber diet. °· Drink enough fluids to keep your urine clear or pale yellow as directed by your health care provider. °· Exercise regularly. °· If you begin to get constipated, increase the amount of fiber in your diet. Eat plenty of fruits, vegetables, whole wheat breads, bran, oatmeal, and similar products. °· Take natural fiber laxatives or other laxatives only as directed by your health care provider. °SEEK MEDICAL CARE IF:  °· You have ongoing rectal pain. °· You require enemas or suppositories more than twice a week. °· You have rectal bleeding. °· You have continued problems, or you develop abdominal pain. °· You have thin, pencil-like stools. °SEEK IMMEDIATE MEDICAL CARE IF:  °You have black or tarry stools. °MAKE SURE YOU:  °· Understand these instructions. °· Will watch your condition. °· Will get help right away if you are not doing well or get worse. °Document Released: 07/12/2004 Document Revised: 08/10/2013 Document Reviewed: 04/26/2013 °ExitCare® Patient Information ©2015 [employer]. This information is not intended to replace advice given to you by your health care provider. Make sure you discuss any questions you have with your health care provider. ° °

## 2014-06-10 NOTE — ED Provider Notes (Addendum)
CSN: [id number]     Arrival date & time 06/10/14  1017 History   First MD Initiated Contact with Patient 06/10/14 1106     Chief Complaint  Patient presents with  . Rectal Pain     (Consider location/radiation/quality/duration/timing/severity/associated sxs/prior Treatment) Patient is a 80 y.o. female presenting with constipation.  Constipation Severity:  Severe Time since last bowel movement:  1 week Timing:  Constant Progression:  Worsening Chronicity:  Chronic Context: not dehydration   Stool description:  Watery Relieved by:  Nothing Worsened by:  Nothing tried Ineffective treatments:  Miralax Associated symptoms: diarrhea   Associated symptoms: no abdominal pain, no back pain, no dysuria, no fever, no nausea and no vomiting     Past Medical History  Diagnosis Date  . Hypertension   . Cancer 1960s    throat cancer   Past Surgical History  Procedure Laterality Date  . Throat cancer surgery  1960's  . Intramedullary (im) nail intertrochanteric Left 03/03/2014    Procedure: INTRAMEDULLARY (IM) NAIL INTERTROCHANTRIC;  Surgeon: [name], MD;  Location: [facility];  Service: Orthopedics;  Laterality: Left;   Family History  Problem Relation Age of Onset  . Hypertension     History  Substance Use Topics  . Smoking status: Never Smoker   . Smokeless tobacco: Never Used  . Alcohol Use: No   OB History   Grav Para Term Preterm Abortions TAB SAB Ect Mult Living                 Review of Systems  Constitutional: Negative for fever and chills.  HENT: Negative for congestion, rhinorrhea and sore throat.   Eyes: Negative for photophobia and visual disturbance.  Respiratory: Negative for cough and shortness of breath.   Cardiovascular: Negative for chest pain and leg swelling.  Gastrointestinal: Positive for diarrhea and constipation. Negative for nausea, vomiting and abdominal pain.  Endocrine: Negative for polyphagia and polyuria.  Genitourinary: Negative for dysuria,  flank pain, vaginal bleeding, vaginal discharge and enuresis.  Musculoskeletal: Negative for back pain and gait problem.  Skin: Negative for color change and rash.  Neurological: Negative for dizziness, syncope, light-headedness and numbness.  Hematological: Negative for adenopathy. Does not bruise/bleed easily.  All other systems reviewed and are negative.     Allergies  Review of patient's allergies indicates no known allergies.  Home Medications   Prior to Admission medications   Medication Sig Start Date End Date Taking? Authorizing Provider  amLODipine (NORVASC) 10 MG tablet Take 10 mg by mouth daily.    Yes Historical Provider, MD  aspirin EC 325 MG tablet Take 1 tablet (325 mg total) by mouth 2 (two) times daily. 03/06/14  Yes Leighton Parody, PA-C  HYDROcodone-acetaminophen (NORCO) 5-325 MG per tablet Take 1 tablet by mouth every 6 (six) hours as needed. For pain 03/07/14  Yes Mahima Pandey, MD  polyethylene glycol (MIRALAX / GLYCOLAX) packet Take 17 g by mouth daily.    Yes Historical Provider, MD  docusate sodium (COLACE) 100 MG capsule Take 1 capsule (100 mg total) by mouth daily. 06/10/14   Debby Freiberg, MD   BP 181/89  Pulse 77  Temp(Src) 97.9 F (36.6 C) (Oral)  Resp 12  Ht 5\' 3"  (1.6 m)  Wt 85 lb (38.556 kg)  BMI 15.06 kg/m2  SpO2 100% Physical Exam  Vitals reviewed. Constitutional: She is oriented to person, place, and time. She appears well-developed and well-nourished.  HENT:  Head: Normocephalic and atraumatic.  Right Ear:  External ear normal.  Left Ear: External ear normal.  Eyes: Conjunctivae and EOM are normal. Pupils are equal, round, and reactive to light.  Neck: Normal range of motion. Neck supple.  Cardiovascular: Normal rate, regular rhythm, normal heart sounds and intact distal pulses.   Pulmonary/Chest: Effort normal and breath sounds normal.  Abdominal: Soft. Bowel sounds are normal. There is no tenderness.  Genitourinary: Rectal exam shows  external hemorrhoid and fissure. Rectal exam shows no internal hemorrhoid, no tenderness and anal tone normal.  Large stool ball in rectum  Musculoskeletal: Normal range of motion.  Neurological: She is alert and oriented to person, place, and time.  Skin: Skin is warm and dry.    ED Course  Fecal disimpaction Date/Time: 06/10/2014 12:56 PM Performed by: Debby Freiberg Authorized by: Debby Freiberg Consent: Verbal consent obtained. Patient identity confirmed: verbally with patient Local anesthesia used: no Patient sedated: no Patient tolerance: Patient tolerated the procedure well with no immediate complications.   (including critical care time) Labs Review Labs Reviewed - No data to display  Imaging Review No results found.   EKG Interpretation None      MDM   Final diagnoses:  Fecal impaction in rectum    80 y.o. female  with pertinent PMH of chronic constipation presents with recurrent constipation and questionable impaction for one week. Patient was in her normal state of constipation, however one week ago began to have recurrent symptoms of infection which included constipation followed by watery diarrhea.  Her family member denies any nausea, vomiting, fever, belly pain, or other concerning symptoms. Prior to my examination the patient had a watery bowel movement followed by a small fully formed stool. Her rectal exam revealed a large stool ball that was originally unable to break my fingers, however after extensive manipulation including a failed attempt with McGill forceps, and Foley catheters, I was able to break distal ballpark and the infection was successfully cleared as above. At no point did I blindly place the [place], they were always guided along an inserted finger and no complications occurred during attempted extraction.  DC home in stable condition with instructions to increase miralax PRN and start colace.    Labs and imaging as above reviewed.   1. Fecal  impaction in rectum         Debby Freiberg, MD 06/10/14 1257  Debby Freiberg, MD 06/10/14 1257

## 2014-10-25 ENCOUNTER — Emergency Department (HOSPITAL_COMMUNITY): Payer: Federal, State, Local not specified - PPO

## 2014-10-25 ENCOUNTER — Encounter (HOSPITAL_COMMUNITY): Payer: Self-pay | Admitting: *Deleted

## 2014-10-25 ENCOUNTER — Emergency Department (HOSPITAL_COMMUNITY)
Admission: EM | Admit: 2014-10-25 | Discharge: 2014-10-25 | Disposition: A | Discharge status: Home / Self Care | Payer: Federal, State, Local not specified - PPO | Attending: Emergency Medicine | Admitting: Emergency Medicine

## 2014-10-25 DIAGNOSIS — R42 Dizziness and giddiness: Secondary | ICD-10-CM | POA: Insufficient documentation

## 2014-10-25 DIAGNOSIS — I10 Essential (primary) hypertension: Secondary | ICD-10-CM | POA: Diagnosis not present

## 2014-10-25 DIAGNOSIS — Z79899 Other long term (current) drug therapy: Secondary | ICD-10-CM | POA: Insufficient documentation

## 2014-10-25 DIAGNOSIS — Z85818 Personal history of malignant neoplasm of other sites of lip, oral cavity, and pharynx: Secondary | ICD-10-CM | POA: Insufficient documentation

## 2014-10-25 DIAGNOSIS — Z7982 Long term (current) use of aspirin: Secondary | ICD-10-CM | POA: Diagnosis not present

## 2014-10-25 LAB — CBC WITH DIFFERENTIAL/PLATELET
Basophils Absolute: 0 10*3/uL (ref 0.0–0.1)
Basophils Relative: 0 % (ref 0–1)
EOS PCT: 1 % (ref 0–5)
Eosinophils Absolute: 0.1 10*3/uL (ref 0.0–0.7)
HCT: 33.6 % — ABNORMAL LOW (ref 36.0–46.0)
Hemoglobin: 10.9 g/dL — ABNORMAL LOW (ref 12.0–15.0)
LYMPHS ABS: 1.3 10*3/uL (ref 0.7–4.0)
LYMPHS PCT: 21 % (ref 12–46)
MCH: 28.3 pg (ref 26.0–34.0)
MCHC: 32.4 g/dL (ref 30.0–36.0)
MCV: 87.3 fL (ref 78.0–100.0)
MONO ABS: 0.8 10*3/uL (ref 0.1–1.0)
Monocytes Relative: 12 % (ref 3–12)
Neutro Abs: 4.1 10*3/uL (ref 1.7–7.7)
Neutrophils Relative %: 66 % (ref 43–77)
Platelets: 278 10*3/uL (ref 150–400)
RBC: 3.85 MIL/uL — AB (ref 3.87–5.11)
RDW: 14.5 % (ref 11.5–15.5)
WBC: 6.2 10*3/uL (ref 4.0–10.5)

## 2014-10-25 LAB — URINALYSIS, ROUTINE W REFLEX MICROSCOPIC
Bilirubin Urine: NEGATIVE
GLUCOSE, UA: NEGATIVE mg/dL
HGB URINE DIPSTICK: NEGATIVE
Ketones, ur: NEGATIVE mg/dL
Nitrite: NEGATIVE
PH: 7 (ref 5.0–8.0)
Protein, ur: NEGATIVE mg/dL
Specific Gravity, Urine: 1.019 (ref 1.005–1.030)
Urobilinogen, UA: 1 mg/dL (ref 0.0–1.0)

## 2014-10-25 LAB — COMPREHENSIVE METABOLIC PANEL
ALK PHOS: 82 U/L (ref 39–117)
ALT: 9 U/L (ref 0–35)
AST: 17 U/L (ref 0–37)
Albumin: 3.4 g/dL — ABNORMAL LOW (ref 3.5–5.2)
Anion gap: 10 (ref 5–15)
BUN: 28 mg/dL — ABNORMAL HIGH (ref 6–23)
CO2: 27 mmol/L (ref 19–32)
Calcium: 9.9 mg/dL (ref 8.4–10.5)
Chloride: 103 mEq/L (ref 96–112)
Creatinine, Ser: 1.03 mg/dL (ref 0.50–1.10)
GFR, EST AFRICAN AMERICAN: 53 mL/min — AB (ref >90)
GFR, EST NON AFRICAN AMERICAN: 46 mL/min — AB (ref >90)
GLUCOSE: 88 mg/dL (ref 70–99)
POTASSIUM: 4.2 mmol/L (ref 3.5–5.1)
Sodium: 140 mmol/L (ref 135–145)
Total Bilirubin: 0.7 mg/dL (ref 0.3–1.2)
Total Protein: 7.2 g/dL (ref 6.0–8.3)

## 2014-10-25 LAB — URINE MICROSCOPIC-ADD ON

## 2014-10-25 LAB — TROPONIN I: Troponin I: <0.03 ng/mL (ref <0.031)

## 2014-10-25 MED ORDER — SODIUM CHLORIDE 0.9 % IV BOLUS (SEPSIS)
500.0000 mL | Freq: Once | INTRAVENOUS | Status: AC
  Administered 2014-10-25: 500 mL via INTRAVENOUS

## 2014-10-25 MED ORDER — CEPHALEXIN 500 MG PO CAPS: 500.0000 mg | ORAL_CAPSULE | Freq: Three times a day (TID) | ORAL | Status: DC

## 2014-10-25 NOTE — ED Provider Notes (Signed)
CSN: [id number]     Arrival date & time 10/25/14  1008 History   First MD Initiated Contact with Patient 10/25/14 1019     Chief Complaint  Patient presents with  . Dizziness     (Consider location/radiation/quality/duration/timing/severity/associated sxs/prior Treatment) Patient is a 80 y.o. female presenting with dizziness. The history is provided by the patient.  Dizziness Quality:  Unable to specify Severity:  Mild Onset quality:  Sudden Duration: 10 min. Timing: once. Progression:  Resolved Chronicity:  New Context comment:  While waking up Relieved by:  Being still Worsened by:  Nothing tried Ineffective treatments:  None tried Associated symptoms: no chest pain, no diarrhea, no headaches, no nausea, no shortness of breath and no vomiting     Past Medical History  Diagnosis Date  . Hypertension   . Cancer 1960s    throat cancer   Past Surgical History  Procedure Laterality Date  . Throat cancer surgery  1960's  . Intramedullary (im) nail intertrochanteric Left 03/03/2014    Procedure: INTRAMEDULLARY (IM) NAIL INTERTROCHANTRIC;  Surgeon: [name], MD;  Location: [facility];  Service: Orthopedics;  Laterality: Left;   Family History  Problem Relation Age of Onset  . Hypertension     History  Substance Use Topics  . Smoking status: Never Smoker   . Smokeless tobacco: Never Used  . Alcohol Use: No   OB History    No data available     Review of Systems  Constitutional: Negative for fever and fatigue.  HENT: Negative for congestion and drooling.   Eyes: Negative for pain.  Respiratory: Negative for cough and shortness of breath.   Cardiovascular: Negative for chest pain.  Gastrointestinal: Negative for nausea, vomiting, abdominal pain and diarrhea.  Genitourinary: Negative for dysuria and hematuria.  Musculoskeletal: Negative for back pain, gait problem and neck pain.  Skin: Negative for color change.  Neurological: Positive for dizziness. Negative for  headaches.  Hematological: Negative for adenopathy.  Psychiatric/Behavioral: Negative for behavioral problems.  All other systems reviewed and are negative.     Allergies  Review of patient's allergies indicates no known allergies.  Home Medications   Prior to Admission medications   Medication Sig Start Date End Date Taking? Authorizing Provider  amLODipine (NORVASC) 10 MG tablet Take 10 mg by mouth daily.     Historical Provider, MD  aspirin EC 325 MG tablet Take 1 tablet (325 mg total) by mouth 2 (two) times daily. 03/06/14   Leighton Parody, PA-C  docusate sodium (COLACE) 100 MG capsule Take 1 capsule (100 mg total) by mouth daily. 06/10/14   Debby Freiberg, MD  HYDROcodone-acetaminophen (NORCO) 5-325 MG per tablet Take 1 tablet by mouth every 6 (six) hours as needed. For pain 03/07/14   Blanchie Serve, MD  polyethylene glycol (MIRALAX / GLYCOLAX) packet Take 17 g by mouth daily.     Historical Provider, MD   BP 155/69 mmHg  Pulse 66  Temp(Src) 98.4 F (36.9 C) (Oral)  Resp 16  SpO2 100% Physical Exam  Constitutional: She is oriented to person, place, and time. She appears well-developed and well-nourished.  HENT:  Head: Normocephalic and atraumatic.  Mouth/Throat: Oropharynx is clear and moist. No oropharyngeal exudate.  Eyes: Conjunctivae and EOM are normal. Pupils are equal, round, and reactive to light.  Neck: Normal range of motion. Neck supple.  Cardiovascular: Normal rate, regular rhythm, normal heart sounds and intact distal pulses.  Exam reveals no gallop and no friction rub.   No  murmur heard. Pulmonary/Chest: Effort normal and breath sounds normal. No respiratory distress. She has no wheezes.  Abdominal: Soft. Bowel sounds are normal. There is no tenderness. There is no rebound and no guarding.  Musculoskeletal: Normal range of motion. She exhibits no edema or tenderness.  Neurological: She is alert and oriented to person, place, and time.  alert, oriented x3 speech:  normal in context and clarity memory: intact grossly cranial nerves II-XII: intact motor strength: full proximally and distally no involuntary movements or tremors sensation: intact to light touch diffusely  cerebellar: finger-to-nose and heel-to-shin intact gait: deferred initially, at end of visit pt is ambulatory at baseline w/ her walker  Skin: Skin is warm and dry.  Psychiatric: She has a normal mood and affect. Her behavior is normal.  Nursing note and vitals reviewed.   ED Course  Procedures (including critical care time) Labs Review Labs Reviewed  CBC WITH DIFFERENTIAL - Abnormal; Notable for the following:    RBC 3.85 (*)    Hemoglobin 10.9 (*)    HCT 33.6 (*)    All other components within normal limits  URINALYSIS, ROUTINE W REFLEX MICROSCOPIC - Abnormal; Notable for the following:    APPearance CLOUDY (*)    Leukocytes, UA LARGE (*)    All other components within normal limits  COMPREHENSIVE METABOLIC PANEL - Abnormal; Notable for the following:    BUN 28 (*)    Albumin 3.4 (*)    GFR calc non Af Amer 46 (*)    GFR calc Af Amer 53 (*)    All other components within normal limits  URINE CULTURE  TROPONIN I  URINE MICROSCOPIC-ADD ON    Imaging Review Dg Chest 2 View  10/25/2014   CLINICAL DATA:  Dizziness and hypertension  EXAM: CHEST  2 VIEW  COMPARISON:  06/17/2012  FINDINGS: Cardiac shadow is stable. No significant vascular congestion is noted. Mild right basilar atelectasis is noted. No focal confluent infiltrate is seen. No sizable effusion is noted. No bony abnormality is seen.  IMPRESSION: Mild right basilar atelectasis.   Electronically Signed   By: Inez Catalina M.D.   On: 10/25/2014 11:21   Ct Head Wo Contrast  10/25/2014   CLINICAL DATA:  Dizziness.  Recent loss of weight.  EXAM: CT HEAD WITHOUT CONTRAST  TECHNIQUE: Contiguous axial images were obtained from the base of the skull through the vertex without intravenous contrast.  COMPARISON:  None.   FINDINGS: There is mild diffuse low-attenuation within the subcortical and periventricular white matter compatible with chronic microvascular disease. Asymmetric low it involving the left posterior parietal lobe is identified, image 22/series 2. There is prominence of the sulci and ventricles consistent with brain atrophy. No acute intracranial hemorrhage or mass noted. No evidence for acute brain infarct. The paranasal sinuses and mastoid air cells are clear. The calvarium is intact.  IMPRESSION: 1. No acute intracranial abnormalities. 2. 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 3. Small vessel ischemic disease.   Electronically Signed   By: Kerby Moors M.D.   On: 10/25/2014 11:30     EKG Interpretation   Date/Time:  Wednesday October 25 2014 10:15:31 EST Ventricular Rate:  65 PR Interval:  195 QRS Duration: 106 QT Interval:  471 QTC Calculation: 490 R Axis:   -64 Text Interpretation:  Sinus rhythm Left anterior fascicular block Left  ventricular hypertrophy Anterior Q waves, possibly due to LVH No  significant change since last tracing  Confirmed by Belynda Pagaduan  MD, [place]  ([id number]) on 10/25/2014 10:20:41 AM      MDM   Final diagnoses:  Dizziness    10:49 AM 80 y.o. female w hx of left hip fx in 5/15 who presents with an episode of dizziness. The daughter and caretaker states that she was getting the patient up around 8:30 AM this morning when the patient stated that she did not feel well. She appeared to slump over and was not unresponsive but had a decreased level of responsiveness for several minutes. The patient states that she felt dizzy during this time but her symptoms resolved after minutes. She is currently asymptomatic and denies any chest pain or shortness of breath. The daughter notes that the patient has a poor appetite at baseline, constipation, but has otherwise been well without fevers, vomiting, or  diarrhea. Vital signs unremarkable here. The patient has a normal neurologic exam. Suspect brief episode of dizziness vs possibly near syncopal event.   1:46 PM: I interpreted/reviewed the labs and/or imaging which were non-contributory.  UA w/ large leuk. Will cover for UTI. Pt ambulatory w/ her walker at baseline. Remains asx on exam.  I have discussed the diagnosis/risks/treatment options with the patient and family and believe the pt to be eligible for discharge home to follow-up with her pcp as needed. We also discussed returning to the ED immediately if new or worsening sx occur. We discussed the sx which are most concerning (e.g., return of dizziness, weakness, numbness, cp, sob) that necessitate immediate return. Medications administered to the patient during their visit and any new prescriptions provided to the patient are listed below.  Medications given during this visit Medications  sodium chloride 0.9 % bolus 500 mL (0 mLs Intravenous Stopped 10/25/14 1136)    New Prescriptions   CEPHALEXIN (KEFLEX) 500 MG CAPSULE    Take 1 capsule (500 mg total) by mouth 3 (three) times daily.     Pamella Pert, MD 10/25/14 1539

## 2014-10-25 NOTE — ED Notes (Signed)
Bed: WA07 Expected date:  Expected time:  Means of arrival:  Comments: ems 

## 2014-10-25 NOTE — ED Notes (Signed)
EMS called out for dizziness. After calling EMS, pt lay back down, felt better upon EMS arrival. Per daughter pt has lost weight recently, poor appetite

## 2014-10-25 NOTE — ED Notes (Signed)
Pt ambulatory with steady gait with a walker without issues and at baseline.  Assisted back to bed.  Family at bedside.  NAD.

## 2014-10-25 NOTE — Discharge Instructions (Signed)

## 2014-10-26 LAB — URINE CULTURE

## 2014-10-29 ENCOUNTER — Inpatient Hospital Stay (HOSPITAL_COMMUNITY): Payer: Medicare Other

## 2014-10-29 ENCOUNTER — Encounter (HOSPITAL_COMMUNITY): Payer: Self-pay | Admitting: Oncology

## 2014-10-29 ENCOUNTER — Inpatient Hospital Stay (HOSPITAL_COMMUNITY)
Admission: EM | Admit: 2014-10-29 | Discharge: 2014-10-31 | DRG: 389 | Disposition: A | Discharge status: Home / Self Care | Payer: Medicare Other | Attending: Internal Medicine | Admitting: Internal Medicine

## 2014-10-29 ENCOUNTER — Emergency Department (HOSPITAL_COMMUNITY): Payer: Medicare Other

## 2014-10-29 DIAGNOSIS — Z681 Body mass index (BMI) 19 or less, adult: Secondary | ICD-10-CM | POA: Diagnosis not present

## 2014-10-29 DIAGNOSIS — I471 Supraventricular tachycardia: Secondary | ICD-10-CM | POA: Diagnosis present

## 2014-10-29 DIAGNOSIS — N390 Urinary tract infection, site not specified: Secondary | ICD-10-CM | POA: Diagnosis present

## 2014-10-29 DIAGNOSIS — Z85819 Personal history of malignant neoplasm of unspecified site of lip, oral cavity, and pharynx: Secondary | ICD-10-CM

## 2014-10-29 DIAGNOSIS — N179 Acute kidney failure, unspecified: Secondary | ICD-10-CM | POA: Diagnosis not present

## 2014-10-29 DIAGNOSIS — R197 Diarrhea, unspecified: Secondary | ICD-10-CM | POA: Diagnosis not present

## 2014-10-29 DIAGNOSIS — Z7982 Long term (current) use of aspirin: Secondary | ICD-10-CM | POA: Diagnosis not present

## 2014-10-29 DIAGNOSIS — R634 Abnormal weight loss: Secondary | ICD-10-CM | POA: Diagnosis present

## 2014-10-29 DIAGNOSIS — K5900 Constipation, unspecified: Secondary | ICD-10-CM

## 2014-10-29 DIAGNOSIS — K649 Unspecified hemorrhoids: Secondary | ICD-10-CM | POA: Diagnosis present

## 2014-10-29 DIAGNOSIS — D72829 Elevated white blood cell count, unspecified: Secondary | ICD-10-CM

## 2014-10-29 DIAGNOSIS — I10 Essential (primary) hypertension: Secondary | ICD-10-CM | POA: Diagnosis present

## 2014-10-29 DIAGNOSIS — K5641 Fecal impaction: Secondary | ICD-10-CM | POA: Diagnosis present

## 2014-10-29 LAB — COMPREHENSIVE METABOLIC PANEL WITH GFR
ALT: 10 U/L (ref 0–35)
AST: 29 U/L (ref 0–37)
Albumin: 3.2 g/dL — ABNORMAL LOW (ref 3.5–5.2)
Alkaline Phosphatase: 81 U/L (ref 39–117)
Anion gap: 10 (ref 5–15)
BUN: 39 mg/dL — ABNORMAL HIGH (ref 6–23)
CO2: 23 mmol/L (ref 19–32)
Calcium: 9.8 mg/dL (ref 8.4–10.5)
Chloride: 108 meq/L (ref 96–112)
Creatinine, Ser: 1.06 mg/dL (ref 0.50–1.10)
GFR calc Af Amer: 52 mL/min — ABNORMAL LOW
GFR calc non Af Amer: 44 mL/min — ABNORMAL LOW
Glucose, Bld: 161 mg/dL — ABNORMAL HIGH (ref 70–99)
Potassium: 4 mmol/L (ref 3.5–5.1)
Sodium: 141 mmol/L (ref 135–145)
Total Bilirubin: 0.5 mg/dL (ref 0.3–1.2)
Total Protein: 7 g/dL (ref 6.0–8.3)

## 2014-10-29 LAB — CBC WITH DIFFERENTIAL/PLATELET
BASOS ABS: 0 10*3/uL (ref 0.0–0.1)
Basophils Relative: 0 % (ref 0–1)
Eosinophils Absolute: 0 10*3/uL (ref 0.0–0.7)
Eosinophils Relative: 0 % (ref 0–5)
HCT: 27.8 % — ABNORMAL LOW (ref 36.0–46.0)
Hemoglobin: 9.2 g/dL — ABNORMAL LOW (ref 12.0–15.0)
LYMPHS ABS: 1 10*3/uL (ref 0.7–4.0)
LYMPHS PCT: 5 % — AB (ref 12–46)
MCH: 28.4 pg (ref 26.0–34.0)
MCHC: 33.1 g/dL (ref 30.0–36.0)
MCV: 85.8 fL (ref 78.0–100.0)
Monocytes Absolute: 1.5 10*3/uL — ABNORMAL HIGH (ref 0.1–1.0)
Monocytes Relative: 8 % (ref 3–12)
NEUTROS ABS: 17.8 10*3/uL — AB (ref 1.7–7.7)
NEUTROS PCT: 87 % — AB (ref 43–77)
PLATELETS: 239 10*3/uL (ref 150–400)
RBC: 3.24 MIL/uL — ABNORMAL LOW (ref 3.87–5.11)
RDW: 14.4 % (ref 11.5–15.5)
WBC: 20.4 10*3/uL — AB (ref 4.0–10.5)

## 2014-10-29 LAB — URINALYSIS, ROUTINE W REFLEX MICROSCOPIC
Glucose, UA: NEGATIVE mg/dL
Hgb urine dipstick: NEGATIVE
Ketones, ur: NEGATIVE mg/dL
Nitrite: NEGATIVE
Protein, ur: 30 mg/dL — AB
Specific Gravity, Urine: 1.02 (ref 1.005–1.030)
Urobilinogen, UA: 1 mg/dL (ref 0.0–1.0)
pH: 6 (ref 5.0–8.0)

## 2014-10-29 LAB — URINE MICROSCOPIC-ADD ON

## 2014-10-29 LAB — I-STAT TROPONIN, ED: Troponin i, poc: 0.01 ng/mL (ref 0.00–0.08)

## 2014-10-29 LAB — I-STAT CG4 LACTIC ACID, ED: LACTIC ACID, VENOUS: 3.73 mmol/L — AB (ref 0.5–2.2)

## 2014-10-29 LAB — I-STAT CHEM 8, ED
BUN: 34 mg/dL — ABNORMAL HIGH (ref 6–23)
Calcium, Ion: 1.32 mmol/L — ABNORMAL HIGH (ref 1.13–1.30)
Chloride: 108 meq/L (ref 96–112)
Creatinine, Ser: 1.1 mg/dL (ref 0.50–1.10)
Glucose, Bld: 158 mg/dL — ABNORMAL HIGH (ref 70–99)
HCT: 30 % — ABNORMAL LOW (ref 36.0–46.0)
Hemoglobin: 10.2 g/dL — ABNORMAL LOW (ref 12.0–15.0)
Potassium: 4.1 mmol/L (ref 3.5–5.1)
Sodium: 141 mmol/L (ref 135–145)
TCO2: 19 mmol/L (ref 0–100)

## 2014-10-29 LAB — CLOSTRIDIUM DIFFICILE BY PCR: Toxigenic C. Difficile by PCR: NEGATIVE

## 2014-10-29 MED ORDER — SODIUM CHLORIDE 0.9 % IV BOLUS (SEPSIS)
500.0000 mL | Freq: Once | INTRAVENOUS | Status: AC
  Administered 2014-10-29: 500 mL via INTRAVENOUS

## 2014-10-29 MED ORDER — ADENOSINE 6 MG/2ML IV SOLN
6.0000 mg | Freq: Once | INTRAVENOUS | Status: AC
Start: 2014-10-29 — End: 2014-10-29
  Administered 2014-10-29: 6 mg via INTRAVENOUS
  Filled 2014-10-29: qty 2

## 2014-10-29 MED ORDER — PIPERACILLIN-TAZOBACTAM 3.375 G IVPB 30 MIN
3.3750 g | Freq: Once | INTRAVENOUS | Status: AC
  Administered 2014-10-29: 3.375 g via INTRAVENOUS
  Filled 2014-10-29: qty 50

## 2014-10-29 MED ORDER — INFLUENZA VAC SPLIT QUAD 0.5 ML IM SUSY
0.5000 mL | PREFILLED_SYRINGE | INTRAMUSCULAR | Status: AC
  Administered 2014-10-30: 0.5 mL via INTRAMUSCULAR
  Filled 2014-10-29 (×2): qty 0.5

## 2014-10-29 MED ORDER — AMLODIPINE BESYLATE 10 MG PO TABS
10.0000 mg | ORAL_TABLET | Freq: Every day | ORAL | Status: DC
  Administered 2014-10-29 – 2014-10-31 (×3): 10 mg via ORAL
  Filled 2014-10-29 (×3): qty 1

## 2014-10-29 MED ORDER — HYDROCORTISONE 2.5 % RE CREA
TOPICAL_CREAM | Freq: Once | RECTAL | Status: AC
  Administered 2014-10-29: 04:00:00 via RECTAL
  Filled 2014-10-29: qty 28.35

## 2014-10-29 MED ORDER — MORPHINE SULFATE 2 MG/ML IJ SOLN
2.0000 mg | INTRAMUSCULAR | Status: DC | PRN
  Filled 2014-10-29: qty 1

## 2014-10-29 MED ORDER — MAGNESIUM SULFATE 50 % IJ SOLN: 2.0000 g | Freq: Once | INTRAMUSCULAR | Status: DC

## 2014-10-29 MED ORDER — ENSURE COMPLETE PO LIQD
237.0000 mL | Freq: Two times a day (BID) | ORAL | Status: DC
  Administered 2014-10-29 – 2014-10-31 (×5): 237 mL via ORAL

## 2014-10-29 MED ORDER — VANCOMYCIN HCL IN DEXTROSE 1-5 GM/200ML-% IV SOLN
1000.0000 mg | Freq: Once | INTRAVENOUS | Status: DC
  Filled 2014-10-29: qty 200

## 2014-10-29 MED ORDER — LINACLOTIDE 145 MCG PO CAPS
145.0000 ug | ORAL_CAPSULE | Freq: Every day | ORAL | Status: DC
  Administered 2014-10-29 – 2014-10-30 (×2): 145 ug via ORAL
  Filled 2014-10-29 (×2): qty 1

## 2014-10-29 MED ORDER — VANCOMYCIN HCL IN DEXTROSE 1-5 GM/200ML-% IV SOLN: 1000.0000 mg | Freq: Once | INTRAVENOUS | Status: DC

## 2014-10-29 MED ORDER — SODIUM CHLORIDE 0.9 % IV SOLN
INTRAVENOUS | Status: DC
  Administered 2014-10-29 – 2014-10-30 (×4): via INTRAVENOUS

## 2014-10-29 MED ORDER — MORPHINE SULFATE 4 MG/ML IJ SOLN: 4.0000 mg | Freq: Once | INTRAMUSCULAR | Status: DC

## 2014-10-29 MED ORDER — HEPARIN SODIUM (PORCINE) 5000 UNIT/ML IJ SOLN
5000.0000 [IU] | Freq: Three times a day (TID) | INTRAMUSCULAR | Status: DC
  Administered 2014-10-29 – 2014-10-31 (×6): 5000 [IU] via SUBCUTANEOUS
  Filled 2014-10-29 (×9): qty 1

## 2014-10-29 MED ORDER — SORBITOL 70 % SOLN
960.0000 mL | TOPICAL_OIL | Freq: Once | ORAL | Status: AC
  Administered 2014-10-29: 960 mL via RECTAL
  Filled 2014-10-29: qty 240

## 2014-10-29 MED ORDER — VANCOMYCIN 50 MG/ML ORAL SOLUTION
125.0000 mg | Freq: Four times a day (QID) | ORAL | Status: DC
  Administered 2014-10-29 (×3): 125 mg via ORAL
  Filled 2014-10-29 (×4): qty 2.5

## 2014-10-29 MED ORDER — MAGNESIUM SULFATE 2 GM/50ML IV SOLN
2.0000 g | Freq: Once | INTRAVENOUS | Status: AC
  Administered 2014-10-29: 2 g via INTRAVENOUS
  Filled 2014-10-29: qty 50

## 2014-10-29 MED ORDER — ASPIRIN EC 325 MG PO TBEC
325.0000 mg | DELAYED_RELEASE_TABLET | Freq: Two times a day (BID) | ORAL | Status: DC
  Administered 2014-10-29 – 2014-10-31 (×4): 325 mg via ORAL
  Filled 2014-10-29 (×5): qty 1

## 2014-10-29 MED ORDER — SODIUM CHLORIDE 0.9 % IV SOLN
INTRAVENOUS | Status: AC
  Administered 2014-10-29: 05:00:00 via INTRAVENOUS

## 2014-10-29 MED ORDER — HYDROCORTISONE 2.5 % RE CREA
TOPICAL_CREAM | Freq: Two times a day (BID) | RECTAL | Status: DC
  Administered 2014-10-29 – 2014-10-31 (×5): via RECTAL
  Filled 2014-10-29: qty 28.35

## 2014-10-29 NOTE — H&P (Signed)
[facility] History and Physical  Jacqueline Huber:[id number] DOB: 03-06-1921 DOA: 10/29/2014  Referring physician: EDP PCP: [name], MD   Chief Complaint: Diarrhea   HPI: Jacqueline Huber is a 80 y.o. female who was recently started on keflex for UTI on 10/25/14.  Patient soon after beginning keflex developed large volume of diarrhea that has been constant and frequent since that time.  This has caused very painful hemorrhoids as well.  BMs have been non-bloody, foul smelling per family although RN staff in ED dosent think that this smells or looks like C.Diff.  Has associated decreased PO intake.  Review of Systems: no cough, no dysuria, no abdominal pain other than rectal pain from hemorrhoids, no rash, no fever, no chills, Systems reviewed.  As above, otherwise negative  Past Medical History  Diagnosis Date  . Hypertension   . Cancer 1960s    throat cancer   Past Surgical History  Procedure Laterality Date  . Throat cancer surgery  1960's  . Intramedullary (im) nail intertrochanteric Left 03/03/2014    Procedure: INTRAMEDULLARY (IM) NAIL INTERTROCHANTRIC;  Surgeon: [name], MD;  Location: [facility];  Service: Orthopedics;  Laterality: Left;   Social History:  reports that she has never smoked. She has never used smokeless tobacco. She reports that she does not drink alcohol or use illicit drugs.  No Known Allergies  Family History  Problem Relation Age of Onset  . Hypertension       Prior to Admission medications   Medication Sig Start Date End Date Taking? Authorizing Provider  amLODipine (NORVASC) 10 MG tablet Take 10 mg by mouth daily.    Yes Historical Provider, MD  aspirin EC 325 MG tablet Take 1 tablet (325 mg total) by mouth 2 (two) times daily. 03/06/14  Yes Leighton Parody, PA-C  cephALEXin (KEFLEX) 500 MG capsule Take 1 capsule (500 mg total) by mouth 3 (three) times daily. 10/25/14 11/01/14 Yes Pamella Pert, MD  polyethylene glycol (MIRALAX /  GLYCOLAX) packet Take 17 g by mouth daily as needed for mild constipation.    Yes Historical Provider, MD   Physical Exam: Filed Vitals:   10/29/14 0445  BP: 169/90  Pulse:   Resp: 14    BP 169/90 mmHg  Pulse 88  Resp 14  SpO2 88%  General Appearance:    Alert, oriented, no distress, appears stated age, cachectic appearing  Head:    Normocephalic, atraumatic  Eyes:    PERRL, EOMI, sclera non-icteric        Nose:   Nares without drainage or epistaxis. Mucosa, turbinates normal  Throat:   Moist mucous membranes. Oropharynx without erythema or exudate.  Neck:   Supple. No carotid bruits.  No thyromegaly.  No lymphadenopathy.   Back:     No CVA tenderness, no spinal tenderness  Lungs:     Clear to auscultation bilaterally, without wheezes, rhonchi or rales  Chest wall:    No tenderness to palpitation  Heart:    Regular rate and rhythm without murmurs, gallops, rubs  Abdomen:     Soft, non-tender, nondistended, normal bowel sounds, no organomegaly  Genitalia:    deferred  Rectal:    deferred  Extremities:   No clubbing, cyanosis or edema.  Pulses:   2+ and symmetric all extremities  Skin:   Skin color, texture, turgor normal, no rashes or lesions  Lymph nodes:   Cervical, supraclavicular, and axillary nodes normal  Neurologic:   CNII-XII intact. Normal strength, sensation  and reflexes      throughout    Labs on Admission:  Basic Metabolic Panel:  Recent Labs Lab 10/25/14 1143 10/29/14 0317 10/29/14 0328  NA 140 141 141  K 4.2 4.0 4.1  CL 103 108 108  CO2 27 23  --   GLUCOSE 88 161* 158*  BUN 28* 39* 34*  CREATININE 1.03 1.06 1.10  CALCIUM 9.9 9.8  --    Liver Function Tests:  Recent Labs Lab 10/25/14 1143 10/29/14 0317  AST 17 29  ALT 9 10  ALKPHOS 82 81  BILITOT 0.7 0.5  PROT 7.2 7.0  ALBUMIN 3.4* 3.2*   No results for input(s): LIPASE, AMYLASE in the last 168 hours. No results for input(s): AMMONIA in the last 168 hours. CBC:  Recent Labs Lab  10/25/14 1050 10/29/14 0317 10/29/14 0328  WBC 6.2 20.4*  --   NEUTROABS 4.1 17.8*  --   HGB 10.9* 9.2* 10.2*  HCT 33.6* 27.8* 30.0*  MCV 87.3 85.8  --   PLT 278 239  --    Cardiac Enzymes:  Recent Labs Lab 10/25/14 1143  TROPONINI <0.03    BNP (last 3 results) No results for input(s): PROBNP in the last 8760 hours. CBG: No results for input(s): GLUCAP in the last 168 hours.  Radiological Exams on Admission: Dg Chest Port 1 View  10/29/2014   CLINICAL DATA:  Acute onset of supraventricular tachycardia. Diarrhea. Initial encounter.  EXAM: PORTABLE CHEST - 1 VIEW  COMPARISON:  Chest radiograph performed 10/25/2014  FINDINGS: External pacing pads are noted, with a metallic device overlying the right hemithorax.  The lungs are well expanded. Mild vascular congestion is noted. Mildly increased interstitial markings may reflect mild interstitial edema. No pleural effusion or pneumothorax is seen.  The cardiomediastinal silhouette is normal in size. No acute osseous abnormalities are identified.  IMPRESSION: Mild vascular congestion noted. Mildly increased interstitial markings may reflect mild interstitial edema.   Electronically Signed   By: Garald Balding M.D.   On: 10/29/2014 04:24    EKG: Independently reviewed.  Assessment/Plan Principal Problem:   Diarrhea Active Problems:   Leukocytosis   SVT (supraventricular tachycardia)   1. Diarrhea and leukocytosis - 1. C.Diff PCR pending 2. GI pathogen panel pending 3. Empiric treatment with PO vanc given WBC of 20k 4. Also getting 1 dose of IV zosyn and IV vanc in ED given by EDP. 5. Will hold off on further doses of IV abx for now unless she develops further SIRS criteria or clinical condition deteriorates, or she develops symptoms suggesting an infection elsewhere. 6. BCx pending 7. May consider CT abd/pelvis if symptoms do not improve with conservative treatment, but other than diarrhea no abdominal tenderness or exam  findings to indicate this. 2. SVT - patient presented to ED and was in asymptomatic SVT, converted with adenosine 1. Tele monitor    Code Status: Full Code  Family Communication: Family at bedside Disposition Plan: Admit to inpatient   Time spent: 27 min  GARDNER, JARED M. [facility] Pager [phone]  If 7AM-7PM, please contact the day team taking care of the patient [email or web address] Password [email or web address] 10/29/2014, 5:20 AM

## 2014-10-29 NOTE — Progress Notes (Signed)
[facility] PROGRESS NOTE  Jacqueline Huber EPP:[id number] DOB: October 27, 1921 DOA: 10/29/2014 PCP: [name], MD  Assessment/Plan: 1. Diarrhea with leukocytosis 1. Cdiff pending 2. Consider possible diarrhea related to chronic constipation? See below 3. Small amount to loose stool this AM 4. Afebrile 5. Pending Cdiff toxin, consider d/c abx if results neg 2. Constipation 1. Large amount of stool seen on abd xray. 2. Will give SMOG enema and give trial of Linzess 3. SVT 1. Stable 2. Was given dose of adenosine in ED 4. DVT prophylaxis 1. Heparin  Code Status: Full Family Communication: Pt in room, family at bedside Disposition Plan: Pending   Consultants:    Procedures:    Antibiotics:  PO vanc (indicate start date, and stop date if known)  HPI/Subjective: No acute events noted overnight. Some loose stool noted this AM.  Objective: Filed Vitals:   10/29/14 0415 10/29/14 0430 10/29/14 0445 10/29/14 0604  BP: 149/88 150/76 169/90 159/78  Pulse:  88    Temp:    98 F (36.7 C)  TempSrc:    Oral  Resp: 13 13 14 16   Height:    5\' 3"  (1.6 m)  Weight:    37.195 kg (82 lb)  SpO2:  88%  100%   No intake or output data in the 24 hours ending 10/29/14 1258 Filed Weights   10/29/14 0604  Weight: 37.195 kg (82 lb)    Exam:   General:  Awake, in nad  Cardiovascular: regular, s1, s2  Respiratory: normal resp effort, no wheezing  Abdomen: decreased BS, palpable stool  Musculoskeletal: perfused, no clubbing   Data Reviewed: Basic Metabolic Panel:  Recent Labs Lab 10/25/14 1143 10/29/14 0317 10/29/14 0328  NA 140 141 141  K 4.2 4.0 4.1  CL 103 108 108  CO2 27 23  --   GLUCOSE 88 161* 158*  BUN 28* 39* 34*  CREATININE 1.03 1.06 1.10  CALCIUM 9.9 9.8  --    Liver Function Tests:  Recent Labs Lab 10/25/14 1143 10/29/14 0317  AST 17 29  ALT 9 10  ALKPHOS 82 81  BILITOT 0.7 0.5  PROT 7.2 7.0  ALBUMIN 3.4* 3.2*   No results for  input(s): LIPASE, AMYLASE in the last 168 hours. No results for input(s): AMMONIA in the last 168 hours. CBC:  Recent Labs Lab 10/25/14 1050 10/29/14 0317 10/29/14 0328  WBC 6.2 20.4*  --   NEUTROABS 4.1 17.8*  --   HGB 10.9* 9.2* 10.2*  HCT 33.6* 27.8* 30.0*  MCV 87.3 85.8  --   PLT 278 239  --    Cardiac Enzymes:  Recent Labs Lab 10/25/14 1143  TROPONINI <0.03   BNP (last 3 results) No results for input(s): PROBNP in the last 8760 hours. CBG: No results for input(s): GLUCAP in the last 168 hours.  Recent Results (from the past 240 hour(s))  Urine culture     Status: None   Collection Time: 10/25/14  1:52 PM  Result Value Ref Range Status   Specimen Description URINE, CATHETERIZED  Final   Special Requests NONE  Final   Culture  Setup Time   Final    10/26/2014 00:37 Performed at [place]   Final    50,000 COLONIES/ML Performed at [facility]    Culture   Final    Multiple bacterial morphotypes present, none predominant. Suggest appropriate recollection if clinically indicated. Performed at [facility]    Report Status  10/26/2014 FINAL  Final     Studies: Dg Chest Port 1 View  10/29/2014   CLINICAL DATA:  Acute onset of supraventricular tachycardia. Diarrhea. Initial encounter.  EXAM: PORTABLE CHEST - 1 VIEW  COMPARISON:  Chest radiograph performed 10/25/2014  FINDINGS: External pacing pads are noted, with a metallic device overlying the right hemithorax.  The lungs are well expanded. Mild vascular congestion is noted. Mildly increased interstitial markings may reflect mild interstitial edema. No pleural effusion or pneumothorax is seen.  The cardiomediastinal silhouette is normal in size. No acute osseous abnormalities are identified.  IMPRESSION: Mild vascular congestion noted. Mildly increased interstitial markings may reflect mild interstitial edema.   Electronically Signed   By: Garald Balding M.D.   On:  10/29/2014 04:24   Dg Abd Portable 1v  10/29/2014   CLINICAL DATA:  Constipation  EXAM: PORTABLE ABDOMEN - 1 VIEW  COMPARISON:  05/08/2012  FINDINGS: There is nonobstructive small bowel gas pattern. No free abdominal air. Significant stool noted throughout the colon. Large amount of stool is noted within rectum which measures at least 10 cm in diameter consistent with significant fecal impaction. Metallic fixation pin noted in visualized proximal left femur.  IMPRESSION: Significant fecal impaction as described above. Large amount of colonic stool.   Electronically Signed   By: Lahoma Crocker M.D.   On: 10/29/2014 12:05    Scheduled Meds: . sodium chloride   Intravenous STAT  . amLODipine  10 mg Oral Daily  . aspirin EC  325 mg Oral BID  . feeding supplement (ENSURE COMPLETE)  237 mL Oral BID BM  . heparin  5,000 Units Subcutaneous 3 times per day  . hydrocortisone   Rectal BID  . [START ON 10/30/2014] Influenza vac split quadrivalent PF  0.5 mL Intramuscular Tomorrow-1000  . Linaclotide  145 mcg Oral Daily  .  morphine injection  4 mg Intravenous Once  . sorbitol, milk of mag, mineral oil, glycerin (SMOG) enema  960 mL Rectal Once  . vancomycin  125 mg Oral QID  . vancomycin  1,000 mg Intravenous Once   Continuous Infusions: . sodium chloride 125 mL/hr at 10/29/14 1022    Principal Problem:   Diarrhea Active Problems:   Leukocytosis   SVT (supraventricular tachycardia)  Time spent: 58min  Franchon Ketterman, [place] [facility] Pager [phone]. If 7PM-7AM, please contact night-coverage at [email or web address], password [facility] 10/29/2014, 12:58 PM  LOS: 0 days

## 2014-10-29 NOTE — ED Notes (Signed)
Patient noted to be in SVT Attempts to convert patient via vagal maneuvers unsuccessful x 3 Patient given Adenosine 6 mg IV, see MAR Patient now in SR with HR 98 EKG obtained Dr. Claudine Mouton present at bedside

## 2014-10-29 NOTE — ED Provider Notes (Signed)
CSN: [id number]     Arrival date & time 10/29/14  0204 History   First MD Initiated Contact with Patient 10/29/14 0240     Chief Complaint  Patient presents with  . Diarrhea     (Consider location/radiation/quality/duration/timing/severity/associated sxs/prior Treatment) Patient is a 80 y.o. female presenting with diarrhea. The history is provided by the patient. No language interpreter was used.  Diarrhea Associated symptoms: no abdominal pain, no chills, no fever and no vomiting   Associated symptoms comment:  Diarrhea for the past 3 days, now with painful hemorrhoids. Per family at bedside who provide care for the patient, she has transient diarrhea that usually lasts for 1-2 days and a history of hemorrhoids as well. Bowel movements have been non-bloody. No fever or abdominal pain. She denies nausea or vomiting. She also has a decreased appetite and has been using Ensure.   Past Medical History  Diagnosis Date  . Hypertension   . Cancer 1960s    throat cancer   Past Surgical History  Procedure Laterality Date  . Throat cancer surgery  1960's  . Intramedullary (im) nail intertrochanteric Left 03/03/2014    Procedure: INTRAMEDULLARY (IM) NAIL INTERTROCHANTRIC;  Surgeon: [name], MD;  Location: [facility];  Service: Orthopedics;  Laterality: Left;   Family History  Problem Relation Age of Onset  . Hypertension     History  Substance Use Topics  . Smoking status: Never Smoker   . Smokeless tobacco: Never Used  . Alcohol Use: No   OB History    No data available     Review of Systems  Constitutional: Negative for fever and chills.  HENT: Negative.   Respiratory: Negative.  Negative for cough.   Cardiovascular: Negative.   Gastrointestinal: Positive for diarrhea. Negative for vomiting and abdominal pain.  Genitourinary: Negative.   Musculoskeletal: Negative.   Skin: Negative.   Neurological: Negative.       Allergies  Review of patient's allergies indicates no  known allergies.  Home Medications   Prior to Admission medications   Medication Sig Start Date End Date Taking? Authorizing Provider  amLODipine (NORVASC) 10 MG tablet Take 10 mg by mouth daily.     Historical Provider, MD  aspirin EC 325 MG tablet Take 1 tablet (325 mg total) by mouth 2 (two) times daily. 03/06/14   Leighton Parody, PA-C  cephALEXin (KEFLEX) 500 MG capsule Take 1 capsule (500 mg total) by mouth 3 (three) times daily. 10/25/14 11/01/14  Pamella Pert, MD  docusate sodium (COLACE) 100 MG capsule Take 1 capsule (100 mg total) by mouth daily. Patient not taking: Reported on 10/25/2014 06/10/14   Debby Freiberg, MD  HYDROcodone-acetaminophen [facility]) 5-325 MG per tablet Take 1 tablet by mouth every 6 (six) hours as needed. For pain Patient not taking: Reported on 10/25/2014 03/07/14   Blanchie Serve, MD  polyethylene glycol (MIRALAX / GLYCOLAX) packet Take 17 g by mouth daily as needed for mild constipation.     Historical Provider, MD   BP 70/52 mmHg  Pulse 74  Resp 18  SpO2 74% Physical Exam  Constitutional: She is oriented to person, place, and time. She appears well-developed and well-nourished.  Awake, cachectic appearing elderly woman.  HENT:  Head: Normocephalic.  Neck: Normal range of motion. Neck supple.  Cardiovascular: Normal rate and regular rhythm.   Pulmonary/Chest: Effort normal and breath sounds normal.  Abdominal: Soft. Bowel sounds are normal. There is no tenderness. There is no rebound and no guarding.  Genitourinary:  Large external, non-thrombosed hemorrhoids.   Musculoskeletal: Normal range of motion.  Neurological: She is alert and oriented to person, place, and time.  Skin: Skin is warm and dry. No rash noted.  Psychiatric: She has a normal mood and affect.    ED Course  Procedures (including critical care time) Labs Review Labs Reviewed  COMPREHENSIVE METABOLIC PANEL  CBC WITH DIFFERENTIAL  URINALYSIS, ROUTINE W REFLEX MICROSCOPIC    Results for orders placed or performed during the hospital encounter of 10/29/14  Comprehensive metabolic panel  Result Value Ref Range   Sodium 141 135 - 145 mmol/L   Potassium 4.0 3.5 - 5.1 mmol/L   Chloride 108 96 - 112 mEq/L   CO2 23 19 - 32 mmol/L   Glucose, Bld 161 (H) 70 - 99 mg/dL   BUN 39 (H) 6 - 23 mg/dL   Creatinine, Ser 1.06 0.50 - 1.10 mg/dL   Calcium 9.8 8.4 - 10.5 mg/dL   Total Protein 7.0 6.0 - 8.3 g/dL   Albumin 3.2 (L) 3.5 - 5.2 g/dL   AST 29 0 - 37 U/L   ALT 10 0 - 35 U/L   Alkaline Phosphatase 81 39 - 117 U/L   Total Bilirubin 0.5 0.3 - 1.2 mg/dL   GFR calc non Af Amer 44 (L) >90 mL/min   GFR calc Af Amer 52 (L) >90 mL/min   Anion gap 10 5 - 15  CBC with Differential  Result Value Ref Range   WBC 20.4 (H) 4.0 - 10.5 K/uL   RBC 3.24 (L) 3.87 - 5.11 MIL/uL   Hemoglobin 9.2 (L) 12.0 - 15.0 g/dL   HCT 27.8 (L) 36.0 - 46.0 %   MCV 85.8 78.0 - 100.0 fL   MCH 28.4 26.0 - 34.0 pg   MCHC 33.1 30.0 - 36.0 g/dL   RDW 14.4 11.5 - 15.5 %   Platelets 239 150 - 400 K/uL   Neutrophils Relative % 87 (H) 43 - 77 %   Neutro Abs 17.8 (H) 1.7 - 7.7 K/uL   Lymphocytes Relative 5 (L) 12 - 46 %   Lymphs Abs 1.0 0.7 - 4.0 K/uL   Monocytes Relative 8 3 - 12 %   Monocytes Absolute 1.5 (H) 0.1 - 1.0 K/uL   Eosinophils Relative 0 0 - 5 %   Eosinophils Absolute 0.0 0.0 - 0.7 K/uL   Basophils Relative 0 0 - 1 %   Basophils Absolute 0.0 0.0 - 0.1 K/uL  I-stat chem 8, ed  Result Value Ref Range   Sodium 141 135 - 145 mmol/L   Potassium 4.1 3.5 - 5.1 mmol/L   Chloride 108 96 - 112 mEq/L   BUN 34 (H) 6 - 23 mg/dL   Creatinine, Ser 1.10 0.50 - 1.10 mg/dL   Glucose, Bld 158 (H) 70 - 99 mg/dL   Calcium, Ion 1.32 (H) 1.13 - 1.30 mmol/L   TCO2 19 0 - 100 mmol/L   Hemoglobin 10.2 (L) 12.0 - 15.0 g/dL   HCT 30.0 (L) 36.0 - 46.0 %  I-Stat CG4 Lactic Acid, ED  Result Value Ref Range   Lactic Acid, Venous 3.73 (H) 0.5 - 2.2 mmol/L  I-stat troponin, ED  Result Value Ref Range    Troponin i, poc 0.01 0.00 - 0.08 ng/mL   Comment 3            Imaging Review No results found.   EKG Interpretation None      MDM   Final diagnoses:  None    1. SVT 2. Diarrhea  Initially, the patient's heart rate manually confirmed in the 70's. She had no complaints other than painful hemorrhoids. Concern of family that diarrhea was continuing longer than her "usual" episodes.   Shortly after being evaluated she developed SVT with HR in 170's. See Dr. Leta Speller note regarding treatment with adenosine. Currently heart rate is 92 on the monitor. The patient is resting well, NAD.  She has marked leukocytosis (20.4), elevated lactic acid (3.73). She has been on antibiotics for UTI since 12/23. Consider C. Diff - cultures ordered, however, with history of recurrent diarrhea and short course of abx doubt C. Diff infection. Plan to admit. [facility] paged for admission.    Dewaine Oats, PA-C 10/29/14 0410  Everlene Balls, MD 10/29/14 [phone]

## 2014-10-29 NOTE — ED Notes (Signed)
Per pt's family she has been having loose stool since Thursday.

## 2014-10-30 DIAGNOSIS — N179 Acute kidney failure, unspecified: Secondary | ICD-10-CM

## 2014-10-30 DIAGNOSIS — K5641 Fecal impaction: Secondary | ICD-10-CM | POA: Diagnosis not present

## 2014-10-30 LAB — BASIC METABOLIC PANEL
Anion gap: 6 (ref 5–15)
BUN: 31 mg/dL — AB (ref 6–23)
CALCIUM: 9.2 mg/dL (ref 8.4–10.5)
CO2: 22 mmol/L (ref 19–32)
Chloride: 114 mEq/L — ABNORMAL HIGH (ref 96–112)
Creatinine, Ser: 1.38 mg/dL — ABNORMAL HIGH (ref 0.50–1.10)
GFR, EST AFRICAN AMERICAN: 37 mL/min — AB (ref >90)
GFR, EST NON AFRICAN AMERICAN: 32 mL/min — AB (ref >90)
Glucose, Bld: 136 mg/dL — ABNORMAL HIGH (ref 70–99)
Potassium: 3.7 mmol/L (ref 3.5–5.1)
SODIUM: 142 mmol/L (ref 135–145)

## 2014-10-30 LAB — GI PATHOGEN PANEL BY PCR, STOOL
C difficile toxin A/B: NEGATIVE
CAMPYLOBACTER BY PCR: NEGATIVE
Cryptosporidium by PCR: NEGATIVE
E COLI (STEC): NEGATIVE
E coli (ETEC) LT/ST: NEGATIVE
E coli 0157 by PCR: NEGATIVE
G lamblia by PCR: NEGATIVE
NOROVIRUS G1/G2: NEGATIVE
ROTAVIRUS A BY PCR: NEGATIVE
Salmonella by PCR: NEGATIVE
Shigella by PCR: NEGATIVE

## 2014-10-30 LAB — CBC
HCT: 31 % — ABNORMAL LOW (ref 36.0–46.0)
Hemoglobin: 10.3 g/dL — ABNORMAL LOW (ref 12.0–15.0)
MCH: 28.4 pg (ref 26.0–34.0)
MCHC: 33.2 g/dL (ref 30.0–36.0)
MCV: 85.4 fL (ref 78.0–100.0)
PLATELETS: 285 10*3/uL (ref 150–400)
RBC: 3.63 MIL/uL — AB (ref 3.87–5.11)
RDW: 14.7 % (ref 11.5–15.5)
WBC: 28.2 10*3/uL — ABNORMAL HIGH (ref 4.0–10.5)

## 2014-10-30 LAB — URINE CULTURE
COLONY COUNT: NO GROWTH
CULTURE: NO GROWTH

## 2014-10-30 MED ORDER — SODIUM CHLORIDE 0.9 % IV SOLN
INTRAVENOUS | Status: AC
  Administered 2014-10-30: 13:00:00 via INTRAVENOUS

## 2014-10-30 NOTE — Progress Notes (Signed)
[facility] PROGRESS NOTE  Jacqueline Huber IRS:[id number] DOB: 1921-07-05 DOA: 10/29/2014 PCP: [name], MD  Assessment/Plan: 1. Diarrhea with leukocytosis 1. Cdiff neg 2. Remains Afebrile 3. Holding abx 4. Pt clinically much improved, however leukocytosis is slightly higher this AM 5. Would cont to hold off on abx and monitor for now 2. Constipation 1. Large amount of stool seen on abd xray. 2. Much improvement wtih SMOG enema and rial of Linzess 3. SVT 1. Remains Stable 2. Was given dose of adenosine in ED 4. DVT prophylaxis 1. Heparin subq 5. ARF 1. Likely secondary to multiple BM yesterday 2. Cont hydration 3. Follow renal function  Code Status: Full Family Communication: Pt in room, family at bedside Disposition Plan: Pending   Consultants:    Procedures:    Antibiotics:  PO vanc d/c'd12/27 (indicate start date, and stop date if known)  HPI/Subjective: Feels much better today and is eager to go home.  Objective: Filed Vitals:   10/29/14 2303 10/30/14 0502 10/30/14 0931 10/30/14 1300  BP: 158/72 148/82 152/69 140/67  Pulse: 90 85 78 78  Temp: 99 F (37.2 C) 98 F (36.7 C)  98.2 F (36.8 C)  TempSrc: Oral Oral  Oral  Resp: 20 20  18   Height:      Weight:      SpO2: 100% 100%  100%    Intake/Output Summary (Last 24 hours) at 10/30/14 1442 Last data filed at 10/30/14 0093  Gross per 24 hour  Intake 2879.59 ml  Output      1 ml  Net 2878.59 ml   Filed Weights   10/29/14 0604  Weight: 37.195 kg (82 lb)    Exam:   General:  Awake, in nad  Cardiovascular: regular, s1, s2  Respiratory: normal resp effort, no wheezing  Abdomen: decreased BS, palpable stool  Musculoskeletal: perfused, no clubbing   Data Reviewed: Basic Metabolic Panel:  Recent Labs Lab 10/25/14 1143 10/29/14 0317 10/29/14 0328 10/30/14 0402  NA 140 141 141 142  K 4.2 4.0 4.1 3.7  CL 103 108 108 114*  CO2 27 23  --  22  GLUCOSE 88 161* 158* 136*   BUN 28* 39* 34* 31*  CREATININE 1.03 1.06 1.10 1.38*  CALCIUM 9.9 9.8  --  9.2   Liver Function Tests:  Recent Labs Lab 10/25/14 1143 10/29/14 0317  AST 17 29  ALT 9 10  ALKPHOS 82 81  BILITOT 0.7 0.5  PROT 7.2 7.0  ALBUMIN 3.4* 3.2*   No results for input(s): LIPASE, AMYLASE in the last 168 hours. No results for input(s): AMMONIA in the last 168 hours. CBC:  Recent Labs Lab 10/25/14 1050 10/29/14 0317 10/29/14 0328 10/30/14 0402  WBC 6.2 20.4*  --  28.2*  NEUTROABS 4.1 17.8*  --   --   HGB 10.9* 9.2* 10.2* 10.3*  HCT 33.6* 27.8* 30.0* 31.0*  MCV 87.3 85.8  --  85.4  PLT 278 239  --  285   Cardiac Enzymes:  Recent Labs Lab 10/25/14 1143  TROPONINI <0.03   BNP (last 3 results) No results for input(s): PROBNP in the last 8760 hours. CBG: No results for input(s): GLUCAP in the last 168 hours.  Recent Results (from the past 240 hour(s))  Urine culture     Status: None   Collection Time: 10/25/14  1:52 PM  Result Value Ref Range Status   Specimen Description URINE, CATHETERIZED  Final   Special Requests NONE  Final   Culture  Setup Time   Final    10/26/2014 00:37 Performed at [place]   Final    50,000 COLONIES/ML Performed at [facility]    Culture   Final    Multiple bacterial morphotypes present, none predominant. Suggest appropriate recollection if clinically indicated. Performed at [facility]    Report Status 10/26/2014 FINAL  Final  Urine culture     Status: None   Collection Time: 10/29/14  4:01 AM  Result Value Ref Range Status   Specimen Description URINE, CATHETERIZED  Final   Special Requests NONE  Final   Colony Count NO GROWTH Performed at [facility]   Final   Culture NO GROWTH Performed at [facility]   Final   Report Status 10/30/2014 FINAL  Final  Culture, blood (routine x 2)     Status: None (Preliminary result)   Collection Time: 10/29/14  6:24 AM  Result  Value Ref Range Status   Specimen Description BLOOD RIGHT ANTECUBITAL  Final   Special Requests BOTTLES DRAWN AEROBIC ONLY 5CC  Final   Culture   Final           BLOOD CULTURE RECEIVED NO GROWTH TO DATE CULTURE WILL BE HELD FOR 5 DAYS BEFORE ISSUING A FINAL NEGATIVE REPORT Performed at [facility]    Report Status PENDING  Incomplete  Culture, blood (routine x 2)     Status: None (Preliminary result)   Collection Time: 10/29/14  6:34 AM  Result Value Ref Range Status   Specimen Description BLOOD LEFT ANTECUBITAL  Final   Special Requests BOTTLES DRAWN AEROBIC AND ANAEROBIC 10CC EACH  Final   Culture   Final           BLOOD CULTURE RECEIVED NO GROWTH TO DATE CULTURE WILL BE HELD FOR 5 DAYS BEFORE ISSUING A FINAL NEGATIVE REPORT Performed at [facility]    Report Status PENDING  Incomplete  Clostridium Difficile by PCR     Status: None   Collection Time: 10/29/14 10:29 AM  Result Value Ref Range Status   C difficile by pcr NEGATIVE NEGATIVE Final    Comment: Performed at [facility]     Studies: Dg Chest Port 1 View  10/29/2014   CLINICAL DATA:  Acute onset of supraventricular tachycardia. Diarrhea. Initial encounter.  EXAM: PORTABLE CHEST - 1 VIEW  COMPARISON:  Chest radiograph performed 10/25/2014  FINDINGS: External pacing pads are noted, with a metallic device overlying the right hemithorax.  The lungs are well expanded. Mild vascular congestion is noted. Mildly increased interstitial markings may reflect mild interstitial edema. No pleural effusion or pneumothorax is seen.  The cardiomediastinal silhouette is normal in size. No acute osseous abnormalities are identified.  IMPRESSION: Mild vascular congestion noted. Mildly increased interstitial markings may reflect mild interstitial edema.   Electronically Signed   By: Garald Balding M.D.   On: 10/29/2014 04:24   Dg Abd Portable 1v  10/29/2014   CLINICAL DATA:  Constipation  EXAM: PORTABLE ABDOMEN - 1 VIEW   COMPARISON:  05/08/2012  FINDINGS: There is nonobstructive small bowel gas pattern. No free abdominal air. Significant stool noted throughout the colon. Large amount of stool is noted within rectum which measures at least 10 cm in diameter consistent with significant fecal impaction. Metallic fixation pin noted in visualized proximal left femur.  IMPRESSION: Significant fecal impaction as described above. Large amount of colonic stool.   Electronically Signed  By: Lahoma Crocker M.D.   On: 10/29/2014 12:05    Scheduled Meds: . amLODipine  10 mg Oral Daily  . aspirin EC  325 mg Oral BID  . feeding supplement (ENSURE COMPLETE)  237 mL Oral BID BM  . heparin  5,000 Units Subcutaneous 3 times per day  . hydrocortisone   Rectal BID   Continuous Infusions: . sodium chloride 100 mL/hr at 10/30/14 1250    Principal Problem:   Diarrhea Active Problems:   Leukocytosis   SVT (supraventricular tachycardia)  Time spent: 71min  Natarsha Hurwitz, [place] [facility] Pager [phone]. If 7PM-7AM, please contact night-coverage at [email or web address], password [facility] 10/30/2014, 2:42 PM  LOS: 1 day

## 2014-10-30 NOTE — Evaluation (Signed)
Physical Therapy Evaluation Patient Details Name: Jacqueline Huber MRN: [id number] DOB: 1921-09-07 Today's Date: 10/30/2014   History of Present Illness  Jacqueline Huber is a 80 y.o. female who was recently started on keflex for UTI on 10/25/14.  Patient soon after beginning keflex developed large volume of diarrhea that has been constant and frequent since that time.  This has caused very painful hemorrhoids as well.  Clinical Impression  Pt admitted with above diagnosis. Pt currently with functional limitations due to the deficits listed below (see PT Problem List). Pt will benefit from skilled PT to increase their independence and safety with mobility to allow discharge to the venue listed below.  Pt limited today by diarrhea and unable to ambulate.     Follow Up Recommendations Home health PT    Equipment Recommendations  None recommended by PT    Recommendations for Other Services       Precautions / Restrictions Precautions Precautions: Fall Restrictions Weight Bearing Restrictions: No      Mobility  Bed Mobility Overal bed mobility: Needs Assistance Bed Mobility: Supine to Sit     Supine to sit: Min assist;HOB elevated     General bed mobility comments: A to get trunk upright, but able to get legs off the bed.  Transfers Overall transfer level: Needs assistance Equipment used: Rolling walker (2 wheeled) Transfers: Sit to/from Stand Sit to Stand: Min assist         General transfer comment: Pt stood to be cleaned, but continued to have loose stool every time she stood up.  Ambulation/Gait             General Gait Details: deferred gait due to diarrhea  Stairs            Wheelchair Mobility    Modified Rankin (Stroke Patients Only)       Balance Overall balance assessment: Needs assistance Sitting-balance support: No upper extremity supported Sitting balance-Leahy Scale: Fair     Standing balance support: Bilateral upper extremity  supported Standing balance-Leahy Scale: Poor Standing balance comment: requires RW                             Pertinent Vitals/Pain Pain Assessment: No/denies pain    Home Living Family/patient expects to be discharged to:: Private residence Living Arrangements: Children Available Help at Discharge: Family;Available PRN/intermittently Type of Home: House Home Access: Level entry     Home Layout: Full bath on main level;Two level Home Equipment: Walker - 2 wheels;Bedside commode Additional Comments: broke L hip in May    Prior Function Level of Independence: Independent with assistive device(s)         Comments: with RW     Hand Dominance        Extremity/Trunk Assessment   Upper Extremity Assessment: Defer to OT evaluation           Lower Extremity Assessment: Generalized weakness      Cervical / Trunk Assessment: Kyphotic  Communication   Communication: No difficulties  Cognition Arousal/Alertness: Awake/alert Behavior During Therapy: WFL for tasks assessed/performed Overall Cognitive Status: History of cognitive impairments - at baseline       Memory: Decreased short-term memory              General Comments General comments (skin integrity, edema, etc.): Pt had soiled self in bed and was unaware.      Exercises  Assessment/Plan    PT Assessment Patient needs continued PT services  PT Diagnosis Generalized weakness   PT Problem List Decreased strength;Decreased activity tolerance;Decreased balance;Decreased mobility  PT Treatment Interventions     PT Goals (Current goals can be found in the Care Plan section) Acute Rehab PT Goals Patient Stated Goal: get diarrhea to stop PT Goal Formulation: With patient/family Time For Goal Achievement: 11/13/14 Potential to Achieve Goals: Good    Frequency Min 3X/week   Barriers to discharge        Co-evaluation               End of Session Equipment Utilized  During Treatment: Gait belt Activity Tolerance: Treatment limited secondary to medical complications (Comment) Patient left: in bed;with family/visitor present;with bed alarm set Nurse Communication: Mobility status         Time: 2924-4628 PT Time Calculation (min) (ACUTE ONLY): 31 min   Charges:   PT Evaluation $Initial PT Evaluation Tier I: 1 Procedure PT Treatments $Therapeutic Activity: 8-22 mins   PT G Codes:        Jacqueline Huber 10/30/2014, 1:10 PM

## 2014-10-30 NOTE — Progress Notes (Signed)
INITIAL NUTRITION ASSESSMENT  DOCUMENTATION CODES Per approved criteria  -Severe malnutrition in the context of chronic illness -Underweight  Pt meets criteria for severe MALNUTRITION in the context of chronic illness as evidenced by 20% body weight loss in < one year, PO intake < 75% for > one month.   INTERVENTION: -Recommend Ensure Complete po BID, each supplement provides 350 kcal and 13 grams of protein -RD to continue to montior  NUTRITION DIAGNOSIS: Inadequate oral intake related to early satiety/advanced age as evidenced by PO intake < 75%, 20 lb unintentional wt loss.   Goal: Pt to meet >/= 90% of their estimated nutrition needs    Monitor:  Total protein/energy intake, labs, weight, GI profile  Reason for Assessment: MST  80 y.o. female  Admitting Dx: Diarrhea  ASSESSMENT: Jacqueline Huber is a 80 y.o. female who was recently started on keflex for UTI on 10/25/14. Patient soon after beginning keflex developed large volume of diarrhea that has been constant and frequent since that time. This has caused very painful hemorrhoids as well.  -Pt's daughter reported pt with progressive decline in appetite and weight for past 5 years after passing of husband -Diet recall indicates pt eats well at breakfast, and with minimal intake for remainder of the day. Daughter was supplementing pt with 3-4 Ensure daily; however has recently decreased to 1-2 daily -Endorsed decreased tastes, pt prefers sweets. Tolerates softer foods at home; however is currently able to tolerate regular diet textures during admit w/out difficulty -Has experienced 20 lb weight loss in past 8 months (19% body wt loss, severe for time frame) -MD noted pt with large stool burden, likely contributing to decreased intake. Recommended SMOG enema -Pt also w/episodes of diarrhea, C.diff r/o -Currently on Regular diet, ate 75% of breakfast and was resting through lunch. Daughter providing encouragement with meals.  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  Height: Ht Readings from Last 1 Encounters:  10/29/14 5\' 3"  (1.6 m)    Weight: Wt Readings from Last 1 Encounters:  10/29/14 82 lb (37.195 kg)    Ideal Body Weight: 115 lb  % Ideal Body Weight: 71%  Wt Readings from Last 10 Encounters:  10/29/14 82 lb (37.195 kg)  06/10/14 85 lb (38.556 kg)  04/03/14 95 lb (43.092 kg)  03/02/14 103 lb 9.9 oz (47 kg)    Usual Body Weight: 103  lb  % Usual Body Weight: 80%  BMI:  Body mass index is 14.53 kg/(m^2). Underweight  Estimated Nutritional Needs: Kcal: 1200-1400 Protein: 55-70 gram Fluid: >/=1200 ml daily  Skin: WDL  Diet Order: Diet regular  EDUCATION NEEDS: -No education needs identified at this time   Intake/Output Summary (Last 24 hours) at 10/30/14 1603 Last data filed at 10/30/14 0729  Gross per 24 hour  Intake 2300.42 ml  Output      0 ml  Net 2300.42 ml    Last BM: 12/28   Labs:   Recent Labs Lab 10/25/14 1143 10/29/14 0317 10/29/14 0328 10/30/14 0402  NA 140 141 141 142  K 4.2 4.0 4.1 3.7  CL 103 108 108 114*  CO2 27 23  --  22  BUN 28* 39* 34* 31*  CREATININE 1.03 1.06 1.10 1.38*  CALCIUM 9.9 9.8  --  9.2  GLUCOSE 88 161* 158* 136*    CBG (last 3)  No results for input(s): GLUCAP in the last  72 hours.  Scheduled Meds: . amLODipine  10 mg Oral Daily  . aspirin EC  325 mg Oral BID  . feeding supplement (ENSURE COMPLETE)  237 mL Oral BID BM  . heparin  5,000 Units Subcutaneous 3 times per day  . hydrocortisone   Rectal BID    Continuous Infusions: . sodium chloride 100 mL/hr at 10/30/14 1250    Past Medical History  Diagnosis Date  . Hypertension   . Cancer 1960s    throat cancer    Past Surgical History  Procedure Laterality Date  . Throat cancer surgery  1960's  . Intramedullary (im) nail intertrochanteric Left 03/03/2014    Procedure: INTRAMEDULLARY (IM)  NAIL INTERTROCHANTRIC;  Surgeon: [name], MD;  Location: [facility];  Service: Orthopedics;  Laterality: Left;    Atlee Abide MS RD [place] Clinical Dietitian [phone]

## 2014-10-31 LAB — COMPREHENSIVE METABOLIC PANEL
ALBUMIN: 2.5 g/dL — AB (ref 3.5–5.2)
ALT: 6 U/L (ref 0–35)
ANION GAP: 6 (ref 5–15)
AST: 13 U/L (ref 0–37)
Alkaline Phosphatase: 71 U/L (ref 39–117)
BUN: 24 mg/dL — AB (ref 6–23)
CO2: 24 mmol/L (ref 19–32)
CREATININE: 1.02 mg/dL (ref 0.50–1.10)
Calcium: 8.8 mg/dL (ref 8.4–10.5)
Chloride: 115 mEq/L — ABNORMAL HIGH (ref 96–112)
GFR calc non Af Amer: 47 mL/min — ABNORMAL LOW (ref >90)
GFR, EST AFRICAN AMERICAN: 54 mL/min — AB (ref >90)
GLUCOSE: 78 mg/dL (ref 70–99)
Potassium: 2.8 mmol/L — ABNORMAL LOW (ref 3.5–5.1)
Sodium: 145 mmol/L (ref 135–145)
TOTAL PROTEIN: 5.5 g/dL — AB (ref 6.0–8.3)
Total Bilirubin: 0.5 mg/dL (ref 0.3–1.2)

## 2014-10-31 LAB — CBC WITH DIFFERENTIAL/PLATELET
Basophils Absolute: 0 10*3/uL (ref 0.0–0.1)
Basophils Relative: 0 % (ref 0–1)
EOS ABS: 0 10*3/uL (ref 0.0–0.7)
Eosinophils Relative: 0 % (ref 0–5)
HEMATOCRIT: 25.5 % — AB (ref 36.0–46.0)
Hemoglobin: 8.7 g/dL — ABNORMAL LOW (ref 12.0–15.0)
LYMPHS PCT: 9 % — AB (ref 12–46)
Lymphs Abs: 1.7 10*3/uL (ref 0.7–4.0)
MCH: 28.7 pg (ref 26.0–34.0)
MCHC: 34.1 g/dL (ref 30.0–36.0)
MCV: 84.2 fL (ref 78.0–100.0)
MONOS PCT: 9 % (ref 3–12)
Monocytes Absolute: 1.7 10*3/uL — ABNORMAL HIGH (ref 0.1–1.0)
NEUTROS ABS: 15.8 10*3/uL — AB (ref 1.7–7.7)
NEUTROS PCT: 82 % — AB (ref 43–77)
Platelets: 246 10*3/uL (ref 150–400)
RBC: 3.03 MIL/uL — ABNORMAL LOW (ref 3.87–5.11)
RDW: 14.8 % (ref 11.5–15.5)
WBC: 19.2 10*3/uL — AB (ref 4.0–10.5)

## 2014-10-31 LAB — MAGNESIUM: MAGNESIUM: 1.9 mg/dL (ref 1.5–2.5)

## 2014-10-31 MED ORDER — HYDROCORTISONE 2.5 % RE CREA: TOPICAL_CREAM | Freq: Two times a day (BID) | RECTAL | Status: AC

## 2014-10-31 MED ORDER — METRONIDAZOLE 500 MG PO TABS: 500.0000 mg | ORAL_TABLET | Freq: Three times a day (TID) | ORAL | Status: DC

## 2014-10-31 MED ORDER — ENSURE COMPLETE PO LIQD: 237.0000 mL | Freq: Two times a day (BID) | ORAL | Status: AC

## 2014-10-31 MED ORDER — POTASSIUM CHLORIDE CRYS ER 20 MEQ PO TBCR
40.0000 meq | EXTENDED_RELEASE_TABLET | Freq: Two times a day (BID) | ORAL | Status: DC
  Administered 2014-10-31: 40 meq via ORAL
  Filled 2014-10-31: qty 2

## 2014-10-31 MED ORDER — POLYETHYLENE GLYCOL 3350 17 G PO PACK: 17.0000 g | PACK | Freq: Every day | ORAL | Status: AC

## 2014-10-31 MED ORDER — DOCUSATE SODIUM 100 MG PO CAPS: 100.0000 mg | ORAL_CAPSULE | Freq: Two times a day (BID) | ORAL | Status: AC

## 2014-10-31 MED ORDER — CIPROFLOXACIN HCL 100 MG PO TABS: 500.0000 mg | ORAL_TABLET | Freq: Two times a day (BID) | ORAL | Status: DC

## 2014-10-31 NOTE — Discharge Instructions (Signed)

## 2014-10-31 NOTE — Care Management Note (Signed)
    Page 1 of 1   10/31/2014     1:23:31 PM CARE MANAGEMENT NOTE 10/31/2014  Patient:  Jacqueline Huber, Jacqueline Huber   Account Number:  [id number]  Date Initiated:  10/30/2014  Documentation initiated by:  Sunday Spillers  Subjective/Objective Assessment:   80 yo female admitted with diarrhea, r/o c diff. PTA lived at home with family     Action/Plan:   Home when stable   Anticipated DC Date:  11/02/2014   Anticipated DC Plan:  [place]  CM consult      [facility] Choice  HOME HEALTH   Choice offered to / List presented to:  C-4 Adult Children        HH arranged  HH-2 PT  HH-3 OT      [place].   Status of service:  Completed, signed off Medicare Important Message given?   (If response is "NO", the following Medicare IM given date fields will be blank) Date Medicare IM given:   Medicare IM given by:   Date Additional Medicare IM given:   Additional Medicare IM given by:    Discharge Disposition:  [place]  Per UR Regulation:  Reviewed for med. necessity/level of care/duration of stay  If discussed at [place] of Stay Meetings, dates discussed:    Comments:  10-31-14 Hartley Cameron [address] [place] with patient's daughter on the phone, discussed [place] orders, offered choice from list verbally. Chose [facility], contacted Kristen with [facility] to arrange. No DME needs noted.

## 2014-10-31 NOTE — Evaluation (Signed)
Occupational Therapy Evaluation Patient Details Name: Jacqueline Huber MRN: [id number] DOB: 1920/11/06 Today's Date: 10/31/2014    History of Present Illness Jacqueline Huber is a 80 y.o. female who was recently started on keflex for UTI on 10/25/14.  Patient soon after beginning keflex developed large volume of diarrhea that has been constant and frequent since that time.  This has caused very painful hemorrhoids as well.   Clinical Impression   Pt is a 80 y/o female admitted as above, she presents w/ generalized weakness and should benefit from acute OT to assist in maximizing independence as she was Independent PTA w/ AD. Will follow acutely for OT to address problems as listed below.    Follow Up Recommendations  Home health OT;Supervision - Intermittent    Equipment Recommendations  Other (comment) (Cont to assess (pt has 3:1 at home))    Recommendations for Other Services       Precautions / Restrictions Precautions Precautions: Fall Restrictions Weight Bearing Restrictions: No      Mobility Bed Mobility Overal bed mobility: Needs Assistance Bed Mobility: Supine to Sit     Supine to sit: Min guard;HOB elevated        Transfers Overall transfer level: Needs assistance Equipment used: Rolling walker (2 wheeled) Transfers: Sit to/from [facility] Sit to Stand: Min guard;Min assist Stand pivot transfers: Min guard       General transfer comment: VC's for safety and sequencing w/ lines. Pt was incontinent of urine x2 but also used 3:1 at bedside as well.    Balance Overall balance assessment: Needs assistance Sitting-balance support: No upper extremity supported Sitting balance-Leahy Scale: Fair     Standing balance support: Bilateral upper extremity supported Standing balance-Leahy Scale: Poor Standing balance comment: Requires HHA or RW to steady.                            ADL Overall ADL's : Needs  assistance/impaired Eating/Feeding: Set up;Sitting   Grooming: Wash/dry hands;Wash/dry face;Set up;Sitting   Upper Body Bathing: Set up;Sitting   Lower Body Bathing: Set up;Min guard;Sit to/from stand   Upper Body Dressing : Set up;Sitting   Lower Body Dressing: Minimal assistance;Sit to/from stand   Toilet Transfer: Min guard;BSC;Stand-pivot;RW   Toileting- Clothing Manipulation and Hygiene: Set up;Min guard;Sit to/from stand       Functional mobility during ADLs: Min guard;Minimal assistance;Rolling walker;Cueing for safety General ADL Comments: Pt was educated in role of OT and participated in ADL retraining session for toileting and functional mobility in room to recliner. She presents w/ generalized weakness and should benefit from acute OT to assist in maximizing independence as she was Independent PTA w/ AD. Will follow acutely.     Vision  Wears glasses for reading                   Perception     Praxis      Pertinent Vitals/Pain Pain Assessment: No/denies pain     Hand Dominance Right   Extremity/Trunk Assessment Upper Extremity Assessment Upper Extremity Assessment: Generalized weakness   Lower Extremity Assessment Lower Extremity Assessment: Generalized weakness;Defer to PT evaluation   Cervical / Trunk Assessment Cervical / Trunk Assessment: Kyphotic   Communication Communication Communication: No difficulties   Cognition Arousal/Alertness: Awake/alert Behavior During Therapy: WFL for tasks assessed/performed Overall Cognitive Status: History of cognitive impairments - at baseline       Memory: Decreased short-term memory  General Comments       Exercises       Shoulder Instructions      Home Living Family/patient expects to be discharged to:: Private residence Living Arrangements: Children Available Help at Discharge: Family;Available PRN/intermittently Type of Home: House Home Access: Level entry     Home  Layout: Full bath on main level;Two level Alternate Level Stairs-Number of Steps: 10 + 3 Alternate Level Stairs-Rails: Right Bathroom Shower/Tub: Occupational psychologist: Standard     Home Equipment: Environmental consultant - 2 wheels;Bedside commode   Additional Comments: broke L hip in May      Prior Functioning/Environment Level of Independence: Independent with assistive device(s)        Comments: with RW    OT Diagnosis: Generalized weakness   OT Problem List: Decreased strength;Impaired balance (sitting and/or standing);Decreased activity tolerance;Decreased knowledge of use of DME or AE   OT Treatment/Interventions: Self-care/ADL training;Energy conservation;DME and/or AE instruction;Patient/family education;Therapeutic activities;Balance training    OT Goals(Current goals can be found in the care plan section) Acute Rehab OT Goals Patient Stated Goal: Get better and go home Time For Goal Achievement: 11/14/14 Potential to Achieve Goals: Good ADL Goals Pt Will Perform Grooming: with modified independence;standing Pt Will Perform Lower Body Bathing: with modified independence;with adaptive equipment;sit to/from stand Pt Will Perform Lower Body Dressing: with modified independence;with adaptive equipment;sit to/from stand Pt Will Transfer to Toilet: with modified independence;ambulating;bedside commode Pt Will Perform Toileting - Clothing Manipulation and hygiene: with modified independence;sit to/from stand Pt Will Perform Tub/Shower Transfer: Shower transfer;ambulating;shower seat;rolling walker  OT Frequency: Min 2X/week   Barriers to D/C:            Co-evaluation              End of Session Equipment Utilized During Treatment: Gait belt;Rolling walker Nurse Communication: Mobility status;Other (comment) (Pt requesting breakfast)  Activity Tolerance: Patient tolerated treatment well Patient left: in chair;with call bell/phone within reach   Time:  0807-0832 OT Time Calculation (min): 25 min Charges:  OT General Charges $OT Visit: 1 Procedure OT Evaluation $Initial OT Evaluation Tier I: 1 Procedure OT Treatments $Self Care/Home Management : 8-22 mins G-Codes:    Almyra Deforest, OT 10/31/2014, 8:42 AM

## 2014-10-31 NOTE — Discharge Summary (Addendum)
Physician Discharge Summary  Jacqueline Huber KGM:[id number] DOB: 04-Jun-1921 DOA: 10/29/2014  PCP: [name], MD  Admit date: 10/29/2014 Discharge date: 10/31/2014  Time spent: 30 minutes  Recommendations for Outpatient Follow-up:  1. Follow up with PCP in 1-2 weeks 2. Would repeat basic metabolic panel in 1-2 weeks, focusing on potassium 3. Repeat CBC in 1-2 weeks, focusing on WBC 4. If constipation continues to be a problem, would recommend outpatient GI referral  Discharge Diagnoses:  Principal Problem:   Diarrhea Active Problems:   Leukocytosis   SVT (supraventricular tachycardia)   Discharge Condition: Improved  Diet recommendation: High fiber, regular  Filed Weights   10/29/14 0604  Weight: 37.195 kg (82 lb)    History of present illness:  Please see admit h and p from 12/27 for details. Briefly, pt presents with complaints of diarrhea and abd pain. The patient was admitted for further work up.  Hospital Course:  1. Diarrhea with leukocytosis 1. Cdiff neg, stool micro studies all neg 2. Remained Afebrile 3. Pt clinically much improved,pt remained with leukocytosis that is now trending down without antibiotics 4. Question leukocytosis related to marked severely impacted stool as pt is feeling near baseline, denies abd pain, denies nausea, tolerating food, afebrile 5. Discussed with staff. Stool output is decreasing in frequency and seems more solidified but still loose. Pt also reports decreased stool frequency. 6. All questions answered. Pt and family voice no concerns when asked. 7. Given leukocytosis, will empirically treat with cipro and flagyl. Pt and family agrees. 2. Constipation 1. Large amount of impacted stool seen on abd xray. 2. Much improvement wtih SMOG enema and rial of Linzess with continued multiple bowel movements 3. SVT 1. Remains Stable 2. Was given dose of adenosine in ED 4. DVT prophylaxis 1. Heparin subq 5. ARF 1. Likely secondary to  multiple BM 2. Resolved with hydration 3. Follow renal function  Consultations:  none  Discharge Exam: Filed Vitals:   10/30/14 1300 10/30/14 2105 10/31/14 0437 10/31/14 1018  BP: 140/67 135/57 144/61 145/62  Pulse: 78 83 73   Temp: 98.2 F (36.8 C) 98.8 F (37.1 C) 98.2 F (36.8 C)   TempSrc: Oral Oral Oral   Resp: 18 18 18    Height:      Weight:      SpO2: 100% 100% 100%    General: awake, in nad Cardiovascular: regular, s1, s2 Respiratory: normal resp effort, no wheezing  Discharge Instructions     Medication List    STOP taking these medications        cephALEXin 500 MG capsule  Commonly known as:  KEFLEX      TAKE these medications        amLODipine 10 MG tablet  Commonly known as:  NORVASC  Take 10 mg by mouth daily.     aspirin EC 325 MG tablet  Take 1 tablet (325 mg total) by mouth 2 (two) times daily.     ciprofloxacin 100 MG tablet  Commonly known as:  CIPRO  Take 5 tablets (500 mg total) by mouth 2 (two) times daily.     docusate sodium 100 MG capsule  Commonly known as:  COLACE  Take 1 capsule (100 mg total) by mouth 2 (two) times daily.     feeding supplement (ENSURE COMPLETE) Liqd  Take 237 mLs by mouth 2 (two) times daily between meals.     hydrocortisone 2.5 % rectal cream  Commonly known as:  ANUSOL-HC  Place rectally 2 (two) times  daily.     metroNIDAZOLE 500 MG tablet  Commonly known as:  FLAGYL  Take 1 tablet (500 mg total) by mouth 3 (three) times daily.     polyethylene glycol packet  Commonly known as:  MIRALAX / GLYCOLAX  Take 17 g by mouth daily.       No Known Allergies Follow-up Information    Follow up with SHAW,KIMBERLEE, MD. Go in 1 week.   Specialty:  Family Medicine   Contact information:   [address] [place] [address] [phone]        The results of significant diagnostics from this hospitalization (including imaging, microbiology, ancillary and laboratory) are listed below  for reference.    Significant Diagnostic Studies: Dg Chest 2 View  10/25/2014   CLINICAL DATA:  Dizziness and hypertension  EXAM: CHEST  2 VIEW  COMPARISON:  06/17/2012  FINDINGS: Cardiac shadow is stable. No significant vascular congestion is noted. Mild right basilar atelectasis is noted. No focal confluent infiltrate is seen. No sizable effusion is noted. No bony abnormality is seen.  IMPRESSION: Mild right basilar atelectasis.   Electronically Signed   By: Inez Catalina M.D.   On: 10/25/2014 11:21   Ct Head Wo Contrast  10/25/2014   CLINICAL DATA:  Dizziness.  Recent loss of weight.  EXAM: CT HEAD WITHOUT CONTRAST  TECHNIQUE: Contiguous axial images were obtained from the base of the skull through the vertex without intravenous contrast.  COMPARISON:  None.  FINDINGS: There is mild diffuse low-attenuation within the subcortical and periventricular white matter compatible with chronic microvascular disease. Asymmetric low it involving the left posterior parietal lobe is identified, image 22/series 2. There is prominence of the sulci and ventricles consistent with brain atrophy. No acute intracranial hemorrhage or mass noted. No evidence for acute brain infarct. The paranasal sinuses and mastoid air cells are clear. The calvarium is intact.  IMPRESSION: 1. No acute intracranial abnormalities. 2. 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 3. Small vessel ischemic disease.   Electronically Signed   By: Kerby Moors M.D.   On: 10/25/2014 11:30   Dg Chest Port 1 View  10/29/2014   CLINICAL DATA:  Acute onset of supraventricular tachycardia. Diarrhea. Initial encounter.  EXAM: PORTABLE CHEST - 1 VIEW  COMPARISON:  Chest radiograph performed 10/25/2014  FINDINGS: External pacing pads are noted, with a metallic device overlying the right hemithorax.  The lungs are well expanded. Mild vascular congestion is noted. Mildly increased  interstitial markings may reflect mild interstitial edema. No pleural effusion or pneumothorax is seen.  The cardiomediastinal silhouette is normal in size. No acute osseous abnormalities are identified.  IMPRESSION: Mild vascular congestion noted. Mildly increased interstitial markings may reflect mild interstitial edema.   Electronically Signed   By: Garald Balding M.D.   On: 10/29/2014 04:24   Dg Abd Portable 1v  10/29/2014   CLINICAL DATA:  Constipation  EXAM: PORTABLE ABDOMEN - 1 VIEW  COMPARISON:  05/08/2012  FINDINGS: There is nonobstructive small bowel gas pattern. No free abdominal air. Significant stool noted throughout the colon. Large amount of stool is noted within rectum which measures at least 10 cm in diameter consistent with significant fecal impaction. Metallic fixation pin noted in visualized proximal left femur.  IMPRESSION: Significant fecal impaction as described above. Large amount of colonic stool.   Electronically Signed   By: Lahoma Crocker M.D.   On: 10/29/2014 12:05  Microbiology: Recent Results (from the past 240 hour(s))  Urine culture     Status: None   Collection Time: 10/25/14  1:52 PM  Result Value Ref Range Status   Specimen Description URINE, CATHETERIZED  Final   Special Requests NONE  Final   Culture  Setup Time   Final    10/26/2014 00:37 Performed at [facility]   Final    50,000 COLONIES/ML Performed at [facility]    Culture   Final    Multiple bacterial morphotypes present, none predominant. Suggest appropriate recollection if clinically indicated. Performed at [facility]    Report Status 10/26/2014 FINAL  Final  Urine culture     Status: None   Collection Time: 10/29/14  4:01 AM  Result Value Ref Range Status   Specimen Description URINE, CATHETERIZED  Final   Special Requests NONE  Final   Colony Count NO GROWTH Performed at [facility]   Final   Culture NO GROWTH Performed at [facility]   Final   Report Status 10/30/2014 FINAL  Final  Culture, blood (routine x 2)     Status: None (Preliminary result)   Collection Time: 10/29/14  6:24 AM  Result Value Ref Range Status   Specimen Description BLOOD RIGHT ANTECUBITAL  Final   Special Requests BOTTLES DRAWN AEROBIC ONLY 5CC  Final   Culture   Final           BLOOD CULTURE RECEIVED NO GROWTH TO DATE CULTURE WILL BE HELD FOR 5 DAYS BEFORE ISSUING A FINAL NEGATIVE REPORT Performed at [facility]    Report Status PENDING  Incomplete  Culture, blood (routine x 2)     Status: None (Preliminary result)   Collection Time: 10/29/14  6:34 AM  Result Value Ref Range Status   Specimen Description BLOOD LEFT ANTECUBITAL  Final   Special Requests BOTTLES DRAWN AEROBIC AND ANAEROBIC 10CC EACH  Final   Culture   Final           BLOOD CULTURE RECEIVED NO GROWTH TO DATE CULTURE WILL BE HELD FOR 5 DAYS BEFORE ISSUING A FINAL NEGATIVE REPORT Performed at [facility]    Report Status PENDING  Incomplete  Clostridium Difficile by PCR     Status: None   Collection Time: 10/29/14 10:29 AM  Result Value Ref Range Status   C difficile by pcr NEGATIVE NEGATIVE Final    Comment: Performed at [facility]: Basic Metabolic Panel:  Recent Labs Lab 10/25/14 1143 10/29/14 0317 10/29/14 0328 10/30/14 0402 10/31/14 0411  NA 140 141 141 142 145  K 4.2 4.0 4.1 3.7 2.8*  CL 103 108 108 114* 115*  CO2 27 23  --  22 24  GLUCOSE 88 161* 158* 136* 78  BUN 28* 39* 34* 31* 24*  CREATININE 1.03 1.06 1.10 1.38* 1.02  CALCIUM 9.9 9.8  --  9.2 8.8  MG  --   --   --   --  1.9   Liver Function Tests:  Recent Labs Lab 10/25/14 1143 10/29/14 0317 10/31/14 0411  AST 17 29 13   ALT 9 10 6   ALKPHOS 82 81 71  BILITOT 0.7 0.5 0.5  PROT 7.2 7.0 5.5*  ALBUMIN 3.4* 3.2* 2.5*   No results for input(s): LIPASE, AMYLASE in the last 168 hours. No results for input(s): AMMONIA in the last 168  hours. CBC:  Recent  Labs Lab 10/25/14 1050 10/29/14 0317 10/29/14 0328 10/30/14 0402 10/31/14 0411  WBC 6.2 20.4*  --  28.2* 19.2*  NEUTROABS 4.1 17.8*  --   --  15.8*  HGB 10.9* 9.2* 10.2* 10.3* 8.7*  HCT 33.6* 27.8* 30.0* 31.0* 25.5*  MCV 87.3 85.8  --  85.4 84.2  PLT 278 239  --  285 246   Cardiac Enzymes:  Recent Labs Lab 10/25/14 1143  TROPONINI <0.03   BNP: BNP (last 3 results) No results for input(s): PROBNP in the last 8760 hours. CBG: No results for input(s): GLUCAP in the last 168 hours.  Signed:  CHIU, STEPHEN K  [facility] 10/31/2014, 11:16 AM

## 2014-11-05 LAB — CULTURE, BLOOD (ROUTINE X 2)
Culture: NO GROWTH
Culture: NO GROWTH

## 2014-12-02 ENCOUNTER — Emergency Department (HOSPITAL_COMMUNITY): Payer: Federal, State, Local not specified - PPO

## 2014-12-02 ENCOUNTER — Encounter (HOSPITAL_COMMUNITY): Payer: Self-pay | Admitting: Emergency Medicine

## 2014-12-02 ENCOUNTER — Emergency Department (HOSPITAL_COMMUNITY)
Admission: EM | Admit: 2014-12-02 | Discharge: 2014-12-02 | Disposition: A | Discharge status: Home / Self Care | Payer: Federal, State, Local not specified - PPO | Attending: Emergency Medicine | Admitting: Emergency Medicine

## 2014-12-02 DIAGNOSIS — I10 Essential (primary) hypertension: Secondary | ICD-10-CM | POA: Diagnosis not present

## 2014-12-02 DIAGNOSIS — Z7982 Long term (current) use of aspirin: Secondary | ICD-10-CM | POA: Insufficient documentation

## 2014-12-02 DIAGNOSIS — Z79899 Other long term (current) drug therapy: Secondary | ICD-10-CM | POA: Insufficient documentation

## 2014-12-02 DIAGNOSIS — K5900 Constipation, unspecified: Secondary | ICD-10-CM | POA: Insufficient documentation

## 2014-12-02 DIAGNOSIS — R339 Retention of urine, unspecified: Secondary | ICD-10-CM | POA: Insufficient documentation

## 2014-12-02 DIAGNOSIS — Z8589 Personal history of malignant neoplasm of other organs and systems: Secondary | ICD-10-CM | POA: Insufficient documentation

## 2014-12-02 DIAGNOSIS — R109 Unspecified abdominal pain: Secondary | ICD-10-CM

## 2014-12-02 HISTORY — DX: Constipation, unspecified: K59.00

## 2014-12-02 LAB — COMPREHENSIVE METABOLIC PANEL
ALBUMIN: 3.4 g/dL — AB (ref 3.5–5.2)
ALT: 9 U/L (ref 0–35)
ANION GAP: 11 (ref 5–15)
AST: 18 U/L (ref 0–37)
Alkaline Phosphatase: 75 U/L (ref 39–117)
BILIRUBIN TOTAL: 0.6 mg/dL (ref 0.3–1.2)
BUN: 19 mg/dL (ref 6–23)
CO2: 26 mmol/L (ref 19–32)
Calcium: 10.2 mg/dL (ref 8.4–10.5)
Chloride: 106 mmol/L (ref 96–112)
Creatinine, Ser: 0.81 mg/dL (ref 0.50–1.10)
GFR, EST AFRICAN AMERICAN: 71 mL/min — AB (ref >90)
GFR, EST NON AFRICAN AMERICAN: 62 mL/min — AB (ref >90)
GLUCOSE: 86 mg/dL (ref 70–99)
Potassium: 3.4 mmol/L — ABNORMAL LOW (ref 3.5–5.1)
SODIUM: 143 mmol/L (ref 135–145)
Total Protein: 7.8 g/dL (ref 6.0–8.3)

## 2014-12-02 LAB — URINALYSIS, ROUTINE W REFLEX MICROSCOPIC
Glucose, UA: NEGATIVE mg/dL
Hgb urine dipstick: NEGATIVE
Ketones, ur: NEGATIVE mg/dL
NITRITE: NEGATIVE
Protein, ur: NEGATIVE mg/dL
Specific Gravity, Urine: 1.017 (ref 1.005–1.030)
Urobilinogen, UA: 0.2 mg/dL (ref 0.0–1.0)
pH: 6.5 (ref 5.0–8.0)

## 2014-12-02 LAB — CBC WITH DIFFERENTIAL/PLATELET
Basophils Absolute: 0 10*3/uL (ref 0.0–0.1)
Basophils Relative: 0 % (ref 0–1)
EOS ABS: 0.1 10*3/uL (ref 0.0–0.7)
Eosinophils Relative: 2 % (ref 0–5)
HCT: 29.7 % — ABNORMAL LOW (ref 36.0–46.0)
Hemoglobin: 9.7 g/dL — ABNORMAL LOW (ref 12.0–15.0)
LYMPHS PCT: 24 % (ref 12–46)
Lymphs Abs: 1.2 10*3/uL (ref 0.7–4.0)
MCH: 28 pg (ref 26.0–34.0)
MCHC: 32.7 g/dL (ref 30.0–36.0)
MCV: 85.6 fL (ref 78.0–100.0)
Monocytes Absolute: 0.5 10*3/uL (ref 0.1–1.0)
Monocytes Relative: 9 % (ref 3–12)
Neutro Abs: 3.2 10*3/uL (ref 1.7–7.7)
Neutrophils Relative %: 65 % (ref 43–77)
Platelets: 329 10*3/uL (ref 150–400)
RBC: 3.47 MIL/uL — ABNORMAL LOW (ref 3.87–5.11)
RDW: 14.9 % (ref 11.5–15.5)
WBC: 5 10*3/uL (ref 4.0–10.5)

## 2014-12-02 LAB — URINE MICROSCOPIC-ADD ON

## 2014-12-02 LAB — LIPASE, BLOOD: LIPASE: 36 U/L (ref 11–59)

## 2014-12-02 MED ORDER — MILK AND MOLASSES ENEMA
1.0000 | Freq: Once | RECTAL | Status: AC
  Administered 2014-12-02: 250 mL via RECTAL
  Filled 2014-12-02: qty 250

## 2014-12-02 MED ORDER — SODIUM CHLORIDE 0.9 % IV BOLUS (SEPSIS): 500.0000 mL | Freq: Once | INTRAVENOUS | Status: DC

## 2014-12-02 NOTE — ED Notes (Signed)
IV team at bedside 

## 2014-12-02 NOTE — Discharge Instructions (Signed)
Continue miralax, senna, fiber rich diet. Drink plenty of fluids. Follow up with your doctor on Monday or Tuesday for recheck. If unable to urinate return to ER.   Constipation Constipation is when a person has fewer than three bowel movements a week, has difficulty having a bowel movement, or has stools that are dry, hard, or larger than normal. As people grow older, constipation is more common. If you try to fix constipation with medicines that make you have a bowel movement (laxatives), the problem may get worse. Long-term laxative use may cause the muscles of the colon to become weak. A low-fiber diet, not taking in enough fluids, and taking certain medicines may make constipation worse.  CAUSES   Certain medicines, such as antidepressants, pain medicine, iron supplements, antacids, and water pills.   Certain diseases, such as diabetes, irritable bowel syndrome (IBS), thyroid disease, or depression.   Not drinking enough water.   Not eating enough fiber-rich foods.   Stress or travel.   Lack of physical activity or exercise.   Ignoring the urge to have a bowel movement.   Using laxatives too much.  SIGNS AND SYMPTOMS   Having fewer than three bowel movements a week.   Straining to have a bowel movement.   Having stools that are hard, dry, or larger than normal.   Feeling full or bloated.   Pain in the lower abdomen.   Not feeling relief after having a bowel movement.  DIAGNOSIS  Your health care provider will take a medical history and perform a physical exam. Further testing may be done for severe constipation. Some tests may include:  A barium enema X-ray to examine your rectum, colon, and, sometimes, your small intestine.   A sigmoidoscopy to examine your lower colon.   A colonoscopy to examine your entire colon. TREATMENT  Treatment will depend on the severity of your constipation and what is causing it. Some dietary treatments include drinking more  fluids and eating more fiber-rich foods. Lifestyle treatments may include regular exercise. If these diet and lifestyle recommendations do not help, your health care provider may recommend taking over-the-counter laxative medicines to help you have bowel movements. Prescription medicines may be prescribed if over-the-counter medicines do not work.  HOME CARE INSTRUCTIONS   Eat foods that have a lot of fiber, such as fruits, vegetables, whole grains, and beans.  Limit foods high in fat and processed sugars, such as french fries, hamburgers, cookies, candies, and soda.   A fiber supplement may be added to your diet if you cannot get enough fiber from foods.   Drink enough fluids to keep your urine clear or pale yellow.   Exercise regularly or as directed by your health care provider.   Go to the restroom when you have the urge to go. Do not hold it.   Only take over-the-counter or prescription medicines as directed by your health care provider. Do not take other medicines for constipation without talking to your health care provider first.  [place] IF:   You have bright red blood in your stool.   Your constipation lasts for more than 4 days or gets worse.   You have abdominal or rectal pain.   You have thin, pencil-like stools.   You have unexplained weight loss. MAKE SURE YOU:   Understand these instructions.  Will watch your condition.  Will get help right away if you are not doing well or get worse. Document Released: 07/18/2004 Document Revised: 10/25/2013 Document  Reviewed: 08/01/2013 [employer] Patient Information 2015 [place], [place]. This information is not intended to replace advice given to you by your health care provider. Make sure you discuss any questions you have with your health care provider.   Acute Urinary Retention Acute urinary retention is the temporary inability to urinate. This is an uncommon problem in women. It can be caused  by:  Infection.  A side effect of a medicine.  A problem in a nearby organ that presses or squeezes on the bladder or the urethra (the tube that drains the bladder).  Psychological problems.   Surgery on your bladder, urethra, or pelvic organs that causes obstruction to the outflow of urine from your bladder. HOME CARE INSTRUCTIONS  If you are sent home with a Foley catheter and a drainage system, you will need to discuss the best course of action with your health care provider. While the catheter is in, maintain a good intake of fluids. Keep the drainage bag emptied and lower than your catheter. This is so that contaminated urine will not flow back into your bladder, which could lead to a urinary tract infection. There are two main types of drainage bags. One is a large bag that usually is used at night. It has a good capacity that will allow you to sleep through the night without having to empty it. The second type is called a leg bag. It has a smaller capacity so it needs to be emptied more frequently. However, the main advantage is that it can be attached by a leg strap and goes underneath your clothing, allowing you the freedom to move about or leave your home. Only take over-the-counter or prescription medicines for pain, discomfort, or fever as directed by your health care provider.  SEEK MEDICAL CARE IF:  You develop a low-grade fever.  You experience spasms or leakage of urine with the spasms. SEEK IMMEDIATE MEDICAL CARE IF:   You develop chills or fever.  Your catheter stops draining urine.  Your catheter falls out.  You start to develop increased bleeding that does not respond to rest and increased fluid intake. MAKE SURE YOU:  Understand these instructions.  Will watch your condition.  Will get help right away if you are not doing well or get worse. Document Released: 10/19/2006 Document Revised: 08/10/2013 Document Reviewed: 03/31/2013 [facility] Patient Information  2015 [place], [place]. This information is not intended to replace advice given to you by your health care provider. Make sure you discuss any questions you have with your health care provider.

## 2014-12-02 NOTE — ED Notes (Signed)
Pt reports constipation -last normal BM was around 3 weeks ago. Has tried Miralax and a "new medication MD prescribed (earlier this week)" but neither are working. Daughter thinks patient is also dehydrated. Has decreased appetite at baseline but has worsened slightly the past few weeks. Denies abdominal pain but daughter reports abdomen is increasingly distended. Hx constipation-was recently hospitalized for similar situation and recently had hemorrhoids. Also tried an herbal tea the MD prescribed recently without alleviation of symptoms. RR even/unlabored.

## 2014-12-02 NOTE — ED Provider Notes (Signed)
CSN: [id number]     Arrival date & time 12/02/14  1151 History   First MD Initiated Contact with Patient 12/02/14 1506     Chief Complaint  Patient presents with  . Constipation     (Consider location/radiation/quality/duration/timing/severity/associated sxs/prior Treatment) HPI Jacqueline Huber is a 81 y.o. female with history of hypertension, chronic constipation, presents to emergency department complaining of constipation and abdominal distention. Patient with ongoing episodes of constipation, but no relief with at home medications. According to daughter, patient has not had a bowel movement in 3 weeks. Patient denies any pain, states she just feels like her abdomen is swollen. She admits to associated anorexia. Denies nausea or vomiting. Denies fever. Daughter states recent admission for similar complaints, at that time tachycardic, dehydrated. States she does not look as bad this time, but she is trying to avoid her getting dehydrated again. Patient was seen by primary care doctor last week for the same complaint, was given medication that she did not take because daughter is concerned about side effects, she does not remember the name of those medications. She states she is currently taking miralax and senna with no relief. Pt states she is passing gas. No rectal pain.   Record review showed that pt presented with SVT, was treated with adenosin. Constipation treated with enema while in the hospital. Pt improved.   Past Medical History  Diagnosis Date  . Hypertension   . Cancer 1960s    throat cancer  . Constipation    Past Surgical History  Procedure Laterality Date  . Throat cancer surgery  1960's  . Intramedullary (im) nail intertrochanteric Left 03/03/2014    Procedure: INTRAMEDULLARY (IM) NAIL INTERTROCHANTRIC;  Surgeon: [name], MD;  Location: [facility];  Service: Orthopedics;  Laterality: Left;   Family History  Problem Relation Age of Onset  . Hypertension     History   Substance Use Topics  . Smoking status: Never Smoker   . Smokeless tobacco: Never Used  . Alcohol Use: No   OB History    No data available     Review of Systems  Constitutional: Positive for appetite change. Negative for fever and chills.  Respiratory: Negative for cough, chest tightness and shortness of breath.   Cardiovascular: Negative for chest pain, palpitations and leg swelling.  Gastrointestinal: Positive for constipation and abdominal distention. Negative for nausea, vomiting, abdominal pain, diarrhea and blood in stool.  Genitourinary: Negative for dysuria and flank pain.  Musculoskeletal: Negative for myalgias, arthralgias, neck pain and neck stiffness.  Skin: Negative for rash.  Neurological: Positive for weakness. Negative for dizziness and headaches.  All other systems reviewed and are negative.     Allergies  Review of patient's allergies indicates no known allergies.  Home Medications   Prior to Admission medications   Medication Sig Start Date End Date Taking? Authorizing Provider  amLODipine (NORVASC) 10 MG tablet Take 10 mg by mouth daily.    Yes Historical Provider, MD  aspirin EC 325 MG tablet Take 1 tablet (325 mg total) by mouth 2 (two) times daily. Patient taking differently: Take 325 mg by mouth daily.  03/06/14  Yes Leighton Parody, PA-C  polyethylene glycol [facility] / GLYCOLAX) packet Take 17 g by mouth daily. 10/31/14  Yes Donne Hazel, MD  ciprofloxacin (CIPRO) 100 MG tablet Take 5 tablets (500 mg total) by mouth 2 (two) times daily. Patient not taking: Reported on 12/02/2014 10/31/14   Donne Hazel, MD  docusate sodium (  COLACE) 100 MG capsule Take 1 capsule (100 mg total) by mouth 2 (two) times daily. Patient not taking: Reported on 12/02/2014 10/31/14   Donne Hazel, MD  feeding supplement, ENSURE COMPLETE, (ENSURE COMPLETE) LIQD Take 237 mLs by mouth 2 (two) times daily between meals. Patient not taking: Reported on 12/02/2014 10/31/14    Donne Hazel, MD  hydrocortisone (ANUSOL-HC) 2.5 % rectal cream Place rectally 2 (two) times daily. Patient not taking: Reported on 12/02/2014 10/31/14   Donne Hazel, MD  metroNIDAZOLE (FLAGYL) 500 MG tablet Take 1 tablet (500 mg total) by mouth 3 (three) times daily. Patient not taking: Reported on 12/02/2014 10/31/14   Donne Hazel, MD   BP 127/69 mmHg  Pulse 62  Temp(Src) 97.8 F (36.6 C) (Oral)  Resp 18  SpO2 100% Physical Exam  Constitutional: She appears well-developed and well-nourished. No distress.  HENT:  Head: Normocephalic.  Eyes: Conjunctivae are normal.  Neck: Neck supple.  Cardiovascular: Normal rate, regular rhythm and normal heart sounds.   Pulmonary/Chest: Effort normal and breath sounds normal. No respiratory distress. She has no wheezes. She has no rales.  Abdominal: Soft. Bowel sounds are normal. She exhibits no distension. There is no tenderness. There is no rebound.  Large mass palpated in the lower left/mid abdomen. Non tender  Musculoskeletal: She exhibits no edema.  Neurological: She is alert.  Skin: Skin is warm and dry.  Psychiatric: She has a normal mood and affect. Her behavior is normal.  Nursing note and vitals reviewed.   ED Course  Procedures (including critical care time) Labs Review Labs Reviewed  CBC WITH DIFFERENTIAL/PLATELET - Abnormal; Notable for the following:    RBC 3.47 (*)    Hemoglobin 9.7 (*)    HCT 29.7 (*)    All other components within normal limits  COMPREHENSIVE METABOLIC PANEL - Abnormal; Notable for the following:    Potassium 3.4 (*)    Albumin 3.4 (*)    GFR calc non Af Amer 62 (*)    GFR calc Af Amer 71 (*)    All other components within normal limits  URINALYSIS, ROUTINE W REFLEX MICROSCOPIC - Abnormal; Notable for the following:    Color, Urine AMBER (*)    Bilirubin Urine SMALL (*)    Leukocytes, UA TRACE (*)    All other components within normal limits  LIPASE, BLOOD  URINE MICROSCOPIC-ADD ON     Imaging Review Dg Abd 2 Views  12/02/2014   CLINICAL DATA:  Abdomen pain and distention for 3 weeks, worsened today.  EXAM: ABDOMEN - 2 VIEW  COMPARISON:  10/29/2014  FINDINGS: There is no free intraperitoneal air. There is a large volume of stool throughout the colon to the rectum, but the large rectal stool mass observed on 10/29/2014 is no longer present. There is a prominent soft tissue contour superimposed on the pelvis which may represent a distended urinary bladder. There is no radiographic evidence of bowel obstruction.  IMPRESSION: 1. Negative for obstruction or free air. 2. Generous volume of stool throughout the colon 3. Question urinary bladder distention   Electronically Signed   By: Andreas Newport M.D.   On: 12/02/2014 17:56     EKG Interpretation None      MDM   Final diagnoses:  Abdominal pain  Constipation, unspecified constipation type  Urinary retention    Patient emergency department with chronic constipation. Symptoms are recurrent. No improvement with at-home medications. She is afebrile. Denies abdominal pain. No nausea or  vomiting. Exam unremarkable, except for a mastitis palpated in the patient's lower abdomen. Most likely stool balls versus possible bladder distention. Will get x-rays and labs.   Patient's labs are unremarkable. White count is slightly elevated. Patient's x-ray shows possible bladder distention, moderate stool throughout. Bladder scanner showed 1000 mL of urine. Patient is able to urinate some, however unable to empty bladder completely. Urinalysis is unremarkable. Patient's bladder was decompressed with an in out catheter, 500 mL of urine out. Abdomen reassessed, no masses palpated at this time. Abdomen is nontender. Patient also received and milk of molasses enema, with large bowel movement. She is feeling much better. She is ready to go home.  Filed Vitals:   12/02/14 1212 12/02/14 1430 12/02/14 1722 12/02/14 2058  BP: 110/62 127/69  148/73 129/81  Pulse: 76 62 86 101  Temp: 97.8 F (36.6 C)   98.4 F (36.9 C)  TempSrc: Oral   Oral  Resp: 16 18 20 20   SpO2: 95% 100% 99% 99%          Renold Genta, PA-C 12/03/14 0025  Dot Lanes, MD 12/03/14 2302

## 2014-12-02 NOTE — ED Notes (Signed)
Attempted IV start x2 w/o success. Notified charge RN Patty who attempted IV start x2 w/o success. IV team notified

## 2014-12-02 NOTE — ED Notes (Addendum)
IV team unsuccessful with IV start. Agricultural consultant and PA notified

## 2014-12-02 NOTE — ED Notes (Addendum)
Tim, RN attempting IV access

## 2014-12-02 NOTE — ED Notes (Signed)
Tim, RN unsuccessful with IV start. PA notified and sts that IV is not necessary at this time unless pt is admitted

## 2014-12-02 NOTE — ED Notes (Signed)
Notified Charge RN that pt has yet to be seen by an EDP.

## 2014-12-09 ENCOUNTER — Encounter (HOSPITAL_COMMUNITY): Payer: Self-pay | Admitting: Emergency Medicine

## 2014-12-09 ENCOUNTER — Emergency Department (HOSPITAL_COMMUNITY)
Admission: EM | Admit: 2014-12-09 | Discharge: 2014-12-10 | Disposition: A | Discharge status: Home / Self Care | Payer: Federal, State, Local not specified - PPO | Attending: Emergency Medicine | Admitting: Emergency Medicine

## 2014-12-09 DIAGNOSIS — I10 Essential (primary) hypertension: Secondary | ICD-10-CM | POA: Diagnosis not present

## 2014-12-09 DIAGNOSIS — Z792 Long term (current) use of antibiotics: Secondary | ICD-10-CM | POA: Insufficient documentation

## 2014-12-09 DIAGNOSIS — K649 Unspecified hemorrhoids: Secondary | ICD-10-CM | POA: Insufficient documentation

## 2014-12-09 DIAGNOSIS — K5900 Constipation, unspecified: Secondary | ICD-10-CM | POA: Insufficient documentation

## 2014-12-09 DIAGNOSIS — Z7982 Long term (current) use of aspirin: Secondary | ICD-10-CM | POA: Insufficient documentation

## 2014-12-09 DIAGNOSIS — Z85819 Personal history of malignant neoplasm of unspecified site of lip, oral cavity, and pharynx: Secondary | ICD-10-CM | POA: Diagnosis not present

## 2014-12-09 DIAGNOSIS — Z79899 Other long term (current) drug therapy: Secondary | ICD-10-CM | POA: Diagnosis not present

## 2014-12-09 NOTE — ED Notes (Signed)
Pt seen a week ago for constipation, was told she had an impaction and was given an enema, after which she had a BM. Since then pt did not have BM until today, when she had very small, hard stool. Pt also c/o hemorrhoids. Pt's family member states she has been giving pt Miralax every other day but pt "only drinks a little bit of it."

## 2014-12-10 MED ORDER — SENNOSIDES-DOCUSATE SODIUM 8.6-50 MG PO TABS: 1.0000 | ORAL_TABLET | Freq: Two times a day (BID) | ORAL | Status: AC

## 2014-12-10 NOTE — Discharge Instructions (Signed)

## 2014-12-10 NOTE — ED Notes (Signed)
Pt had a large stool burden.  After assistance w/ manual disimpaction of hard stool pt began to pass a large amount of soft stool.

## 2014-12-14 NOTE — ED Provider Notes (Signed)
CSN: [id number]     Arrival date & time 12/09/14  2300 History   First MD Initiated Contact with Patient 12/09/14 2352     Chief Complaint  Patient presents with  . Constipation  . Hemorrhoids     (Consider location/radiation/quality/duration/timing/severity/associated sxs/prior Treatment) HPI   75yf brought in by family for evaluation of constipation. Ongoing issue. Seen in ED recently for same. BM after enema but no stool since aside from passing tiny amount of hard "pebbles" today. Complaining of abdominal discomfort. Takes very little miralax despite family encouragement. No vomiting. No distension.   Past Medical History  Diagnosis Date  . Hypertension   . Cancer 1960s    throat cancer  . Constipation    Past Surgical History  Procedure Laterality Date  . Throat cancer surgery  1960's  . Intramedullary (im) nail intertrochanteric Left 03/03/2014    Procedure: INTRAMEDULLARY (IM) NAIL INTERTROCHANTRIC;  Surgeon: [name], MD;  Location: [facility];  Service: Orthopedics;  Laterality: Left;   Family History  Problem Relation Age of Onset  . Hypertension     History  Substance Use Topics  . Smoking status: Never Smoker   . Smokeless tobacco: Never Used  . Alcohol Use: No   OB History    No data available     Review of Systems  All systems reviewed and negative, other than as noted in HPI.   Allergies  Review of patient's allergies indicates no known allergies.  Home Medications   Prior to Admission medications   Medication Sig Start Date End Date Taking? Authorizing Provider  amLODipine (NORVASC) 10 MG tablet Take 10 mg by mouth daily.    Yes Historical Provider, MD  feeding supplement, ENSURE COMPLETE, (ENSURE COMPLETE) LIQD Take 237 mLs by mouth 2 (two) times daily between meals. 10/31/14  Yes Donne Hazel, MD  hydrocortisone (ANUSOL-HC) 2.5 % rectal cream Place rectally 2 (two) times daily. 10/31/14  Yes Donne Hazel, MD  polyethylene glycol [facility] /  Floria Raveling) packet Take 17 g by mouth daily. 10/31/14  Yes Donne Hazel, MD  aspirin EC 325 MG tablet Take 1 tablet (325 mg total) by mouth 2 (two) times daily. Patient not taking: Reported on 12/10/2014 03/06/14   Leighton Parody, PA-C  ciprofloxacin (CIPRO) 100 MG tablet Take 5 tablets (500 mg total) by mouth 2 (two) times daily. Patient not taking: Reported on 12/02/2014 10/31/14   Donne Hazel, MD  docusate sodium (COLACE) 100 MG capsule Take 1 capsule (100 mg total) by mouth 2 (two) times daily. Patient not taking: Reported on 12/02/2014 10/31/14   Donne Hazel, MD  metroNIDAZOLE (FLAGYL) 500 MG tablet Take 1 tablet (500 mg total) by mouth 3 (three) times daily. Patient not taking: Reported on 12/02/2014 10/31/14   Donne Hazel, MD  senna-docusate (SENOKOT-S) 8.6-50 MG per tablet Take 1 tablet by mouth 2 (two) times daily. 12/10/14   Virgel Manifold, MD   BP 138/68 mmHg  Pulse 86  Temp(Src) 98.3 F (36.8 C) (Oral)  Resp 15  SpO2 99% Physical Exam  Constitutional: No distress.  Frail/chronically ill appearing but in NAD.  HENT:  Head: Normocephalic and atraumatic.  Eyes: Conjunctivae are normal. Right eye exhibits no discharge. Left eye exhibits no discharge.  Neck: Neck supple.  Cardiovascular: Normal rate, regular rhythm and normal heart sounds.  Exam reveals no gallop and no friction rub.   No murmur heard. Pulmonary/Chest: Effort normal and breath sounds normal. No respiratory distress.  Abdominal: Soft. She exhibits no distension. There is no tenderness.  Genitourinary:  Poor rectal tone. No significant palpable stool in rectum after having BM. Brown stool noted on glove.  Musculoskeletal: She exhibits no edema or tenderness.  Neurological: She is alert.  Skin: Skin is warm and dry.  Psychiatric: She has a normal mood and affect. Her behavior is normal. Thought content normal.  Nursing note and vitals reviewed.   ED Course  Procedures (including critical care time) Labs  Review Labs Reviewed - No data to display  Imaging Review No results found.   EKG Interpretation None      MDM   Final diagnoses:  Constipation, unspecified constipation type    89yF with constipation. Had large BM in ED. Feels better. Advised to take miralax but family reports difficult to get her to drink it. Takes pills well. Senokot prescribed.     Virgel Manifold, MD 12/14/14 1208

## 2014-12-20 ENCOUNTER — Emergency Department (HOSPITAL_COMMUNITY): Payer: Medicare Other

## 2014-12-20 ENCOUNTER — Encounter (HOSPITAL_COMMUNITY): Payer: Self-pay | Admitting: Emergency Medicine

## 2014-12-20 ENCOUNTER — Inpatient Hospital Stay (HOSPITAL_COMMUNITY)
Admission: EM | Admit: 2014-12-20 | Discharge: 2014-12-25 | DRG: 871 | Disposition: A | Discharge status: Skilled Nursing Facility | Payer: Medicare Other | Attending: Internal Medicine | Admitting: Internal Medicine

## 2014-12-20 DIAGNOSIS — F329 Major depressive disorder, single episode, unspecified: Secondary | ICD-10-CM | POA: Diagnosis present

## 2014-12-20 DIAGNOSIS — A4189 Other specified sepsis: Principal | ICD-10-CM | POA: Diagnosis present

## 2014-12-20 DIAGNOSIS — E871 Hypo-osmolality and hyponatremia: Secondary | ICD-10-CM

## 2014-12-20 DIAGNOSIS — Z8249 Family history of ischemic heart disease and other diseases of the circulatory system: Secondary | ICD-10-CM | POA: Diagnosis not present

## 2014-12-20 DIAGNOSIS — K5909 Other constipation: Secondary | ICD-10-CM | POA: Insufficient documentation

## 2014-12-20 DIAGNOSIS — R64 Cachexia: Secondary | ICD-10-CM | POA: Diagnosis present

## 2014-12-20 DIAGNOSIS — E46 Unspecified protein-calorie malnutrition: Secondary | ICD-10-CM | POA: Diagnosis present

## 2014-12-20 DIAGNOSIS — R630 Anorexia: Secondary | ICD-10-CM | POA: Insufficient documentation

## 2014-12-20 DIAGNOSIS — K5900 Constipation, unspecified: Secondary | ICD-10-CM | POA: Diagnosis present

## 2014-12-20 DIAGNOSIS — I10 Essential (primary) hypertension: Secondary | ICD-10-CM | POA: Diagnosis present

## 2014-12-20 DIAGNOSIS — E43 Unspecified severe protein-calorie malnutrition: Secondary | ICD-10-CM | POA: Diagnosis present

## 2014-12-20 DIAGNOSIS — R531 Weakness: Secondary | ICD-10-CM | POA: Diagnosis present

## 2014-12-20 DIAGNOSIS — Z79899 Other long term (current) drug therapy: Secondary | ICD-10-CM | POA: Diagnosis not present

## 2014-12-20 DIAGNOSIS — D72829 Elevated white blood cell count, unspecified: Secondary | ICD-10-CM

## 2014-12-20 DIAGNOSIS — Z66 Do not resuscitate: Secondary | ICD-10-CM | POA: Diagnosis present

## 2014-12-20 DIAGNOSIS — Z681 Body mass index (BMI) 19 or less, adult: Secondary | ICD-10-CM | POA: Diagnosis not present

## 2014-12-20 DIAGNOSIS — Z8781 Personal history of (healed) traumatic fracture: Secondary | ICD-10-CM | POA: Diagnosis not present

## 2014-12-20 DIAGNOSIS — N390 Urinary tract infection, site not specified: Secondary | ICD-10-CM | POA: Diagnosis present

## 2014-12-20 DIAGNOSIS — R1310 Dysphagia, unspecified: Secondary | ICD-10-CM | POA: Diagnosis present

## 2014-12-20 DIAGNOSIS — B961 Klebsiella pneumoniae [K. pneumoniae] as the cause of diseases classified elsewhere: Secondary | ICD-10-CM | POA: Diagnosis present

## 2014-12-20 DIAGNOSIS — F0390 Unspecified dementia without behavioral disturbance: Secondary | ICD-10-CM | POA: Diagnosis present

## 2014-12-20 DIAGNOSIS — A419 Sepsis, unspecified organism: Secondary | ICD-10-CM | POA: Diagnosis present

## 2014-12-20 DIAGNOSIS — E860 Dehydration: Secondary | ICD-10-CM | POA: Diagnosis present

## 2014-12-20 DIAGNOSIS — B370 Candidal stomatitis: Secondary | ICD-10-CM | POA: Diagnosis present

## 2014-12-20 DIAGNOSIS — R627 Adult failure to thrive: Secondary | ICD-10-CM | POA: Diagnosis present

## 2014-12-20 DIAGNOSIS — Z85819 Personal history of malignant neoplasm of unspecified site of lip, oral cavity, and pharynx: Secondary | ICD-10-CM | POA: Diagnosis not present

## 2014-12-20 DIAGNOSIS — N179 Acute kidney failure, unspecified: Secondary | ICD-10-CM | POA: Diagnosis present

## 2014-12-20 DIAGNOSIS — Z7401 Bed confinement status: Secondary | ICD-10-CM

## 2014-12-20 DIAGNOSIS — Z515 Encounter for palliative care: Secondary | ICD-10-CM | POA: Diagnosis not present

## 2014-12-20 DIAGNOSIS — E870 Hyperosmolality and hypernatremia: Secondary | ICD-10-CM | POA: Diagnosis present

## 2014-12-20 DIAGNOSIS — M1990 Unspecified osteoarthritis, unspecified site: Secondary | ICD-10-CM | POA: Diagnosis present

## 2014-12-20 DIAGNOSIS — Z4659 Encounter for fitting and adjustment of other gastrointestinal appliance and device: Secondary | ICD-10-CM

## 2014-12-20 DIAGNOSIS — D649 Anemia, unspecified: Secondary | ICD-10-CM | POA: Diagnosis present

## 2014-12-20 HISTORY — DX: Malignant neoplasm of pharynx, unspecified: C14.0

## 2014-12-20 HISTORY — DX: Depression, unspecified: F32.A

## 2014-12-20 HISTORY — DX: Major depressive disorder, single episode, unspecified: F32.9

## 2014-12-20 HISTORY — DX: Unspecified osteoarthritis, unspecified site: M19.90

## 2014-12-20 LAB — CBC WITH DIFFERENTIAL/PLATELET
Basophils Absolute: 0 10*3/uL (ref 0.0–0.1)
Basophils Relative: 0 % (ref 0–1)
Eosinophils Absolute: 0 10*3/uL (ref 0.0–0.7)
Eosinophils Relative: 0 % (ref 0–5)
HEMATOCRIT: 31.3 % — AB (ref 36.0–46.0)
HEMOGLOBIN: 9.5 g/dL — AB (ref 12.0–15.0)
LYMPHS ABS: 1.1 10*3/uL (ref 0.7–4.0)
LYMPHS PCT: 5 % — AB (ref 12–46)
MCH: 26.8 pg (ref 26.0–34.0)
MCHC: 30.4 g/dL (ref 30.0–36.0)
MCV: 88.4 fL (ref 78.0–100.0)
MONO ABS: 1.4 10*3/uL — AB (ref 0.1–1.0)
MONOS PCT: 7 % (ref 3–12)
NEUTROS ABS: 19.6 10*3/uL — AB (ref 1.7–7.7)
Neutrophils Relative %: 88 % — ABNORMAL HIGH (ref 43–77)
Platelets: 378 10*3/uL (ref 150–400)
RBC: 3.54 MIL/uL — AB (ref 3.87–5.11)
RDW: 15.3 % (ref 11.5–15.5)
WBC: 22.2 10*3/uL — AB (ref 4.0–10.5)

## 2014-12-20 LAB — COMPREHENSIVE METABOLIC PANEL
ALK PHOS: 80 U/L (ref 39–117)
ALT: 27 U/L (ref 0–35)
ANION GAP: 11 (ref 5–15)
AST: 27 U/L (ref 0–37)
Albumin: 2.6 g/dL — ABNORMAL LOW (ref 3.5–5.2)
BUN: 114 mg/dL — ABNORMAL HIGH (ref 6–23)
CALCIUM: 10.6 mg/dL — AB (ref 8.4–10.5)
CO2: 25 mmol/L (ref 19–32)
Chloride: 123 mmol/L — ABNORMAL HIGH (ref 96–112)
Creatinine, Ser: 2.45 mg/dL — ABNORMAL HIGH (ref 0.50–1.10)
GFR calc non Af Amer: 16 mL/min — ABNORMAL LOW (ref >90)
GFR, EST AFRICAN AMERICAN: 19 mL/min — AB (ref >90)
GLUCOSE: 108 mg/dL — AB (ref 70–99)
POTASSIUM: 5.1 mmol/L (ref 3.5–5.1)
Sodium: 159 mmol/L — ABNORMAL HIGH (ref 135–145)
TOTAL PROTEIN: 7.9 g/dL (ref 6.0–8.3)
Total Bilirubin: 0.6 mg/dL (ref 0.3–1.2)

## 2014-12-20 LAB — URINALYSIS, ROUTINE W REFLEX MICROSCOPIC
Glucose, UA: NEGATIVE mg/dL
KETONES UR: 15 mg/dL — AB
Nitrite: NEGATIVE
PH: 8 (ref 5.0–8.0)
Specific Gravity, Urine: 1.025 (ref 1.005–1.030)
Urobilinogen, UA: 0.2 mg/dL (ref 0.0–1.0)

## 2014-12-20 LAB — I-STAT TROPONIN, ED: Troponin i, poc: 0.08 ng/mL (ref 0.00–0.08)

## 2014-12-20 LAB — URINE MICROSCOPIC-ADD ON

## 2014-12-20 LAB — LACTIC ACID, PLASMA: Lactic Acid, Venous: 2.3 mmol/L (ref 0.5–2.0)

## 2014-12-20 MED ORDER — DOCUSATE SODIUM 100 MG PO CAPS
100.0000 mg | ORAL_CAPSULE | Freq: Two times a day (BID) | ORAL | Status: DC
Start: 2014-12-20 — End: 2014-12-24
  Administered 2014-12-20 – 2014-12-23 (×4): 100 mg via ORAL
  Filled 2014-12-20 (×11): qty 1

## 2014-12-20 MED ORDER — SODIUM CHLORIDE 0.9 % IV SOLN
INTRAVENOUS | Status: DC
  Administered 2014-12-20 – 2014-12-21 (×2): via INTRAVENOUS

## 2014-12-20 MED ORDER — ONDANSETRON HCL 4 MG PO TABS: 4.0000 mg | ORAL_TABLET | Freq: Four times a day (QID) | ORAL | Status: DC | PRN

## 2014-12-20 MED ORDER — DEXTROSE 5 % IV SOLN: 1.0000 g | Freq: Once | INTRAVENOUS | Status: DC

## 2014-12-20 MED ORDER — BISACODYL 10 MG RE SUPP: 10.0000 mg | Freq: Every day | RECTAL | Status: DC | PRN

## 2014-12-20 MED ORDER — HEPARIN SODIUM (PORCINE) 5000 UNIT/ML IJ SOLN
5000.0000 [IU] | Freq: Three times a day (TID) | INTRAMUSCULAR | Status: DC
  Administered 2014-12-20 – 2014-12-25 (×13): 5000 [IU] via SUBCUTANEOUS
  Filled 2014-12-20 (×16): qty 1

## 2014-12-20 MED ORDER — SODIUM CHLORIDE 0.9 % IV SOLN
Freq: Once | INTRAVENOUS | Status: AC
  Administered 2014-12-20: 1000 mL via INTRAVENOUS

## 2014-12-20 MED ORDER — POLYETHYLENE GLYCOL 3350 17 G PO PACK
17.0000 g | PACK | Freq: Every day | ORAL | Status: DC
  Administered 2014-12-21 – 2014-12-22 (×2): 17 g via ORAL
  Filled 2014-12-20 (×2): qty 1

## 2014-12-20 MED ORDER — CEFTRIAXONE SODIUM IN DEXTROSE 20 MG/ML IV SOLN
1.0000 g | INTRAVENOUS | Status: DC
  Administered 2014-12-20: 1 g via INTRAVENOUS
  Filled 2014-12-20 (×3): qty 50

## 2014-12-20 MED ORDER — SODIUM CHLORIDE 0.9 % IJ SOLN
3.0000 mL | Freq: Two times a day (BID) | INTRAMUSCULAR | Status: DC
  Administered 2014-12-20 – 2014-12-25 (×6): 3 mL via INTRAVENOUS

## 2014-12-20 MED ORDER — ENSURE COMPLETE PO LIQD
237.0000 mL | Freq: Three times a day (TID) | ORAL | Status: DC
  Administered 2014-12-21 – 2014-12-25 (×6): 237 mL via ORAL

## 2014-12-20 MED ORDER — ONDANSETRON HCL 4 MG/2ML IJ SOLN
4.0000 mg | Freq: Four times a day (QID) | INTRAMUSCULAR | Status: DC | PRN
  Administered 2014-12-22: 4 mg via INTRAVENOUS
  Filled 2014-12-20: qty 2

## 2014-12-20 MED ORDER — SODIUM CHLORIDE 0.9 % IV BOLUS (SEPSIS): 500.0000 mL | Freq: Once | INTRAVENOUS | Status: DC

## 2014-12-20 MED ORDER — ACETAMINOPHEN 325 MG PO TABS: 650.0000 mg | ORAL_TABLET | Freq: Four times a day (QID) | ORAL | Status: DC | PRN

## 2014-12-20 MED ORDER — ACETAMINOPHEN 650 MG RE SUPP
650.0000 mg | Freq: Four times a day (QID) | RECTAL | Status: DC | PRN
Start: 2014-12-20 — End: 2014-12-25

## 2014-12-20 NOTE — ED Provider Notes (Signed)
CSN: [id number]     Arrival date & time 12/20/14  1010 History   First MD Initiated Contact with Patient 12/20/14 1012     Chief Complaint  Patient presents with  . Failure To Thrive  . Weakness      HPI  Patient presents for evaluation via [place]. [place] by family. Hospitalized for constipation and weakness a few weeks ago. Family states she is really not had much output to make her concerns about because.. She's been profoundly weak. Previously was able to get out of bed and walk to the bathroom but did require some simple assistance with ADLs. However since her last hospitalization she's had more difficulty with ADLs. She is not eating. She has a his comforter mouth with swallowing. Daughters concerned because she states that she is reviewed out rather than eating. She is not drinking much. No fever. No cough. Remains lucid and conversational, however, just weaker, and  in bed most of the time.  Past Medical History  Diagnosis Date  . Hypertension   . Cancer 1960s    throat cancer  . Constipation    Past Surgical History  Procedure Laterality Date  . Throat cancer surgery  1960's  . Intramedullary (im) nail intertrochanteric Left 03/03/2014    Procedure: INTRAMEDULLARY (IM) NAIL INTERTROCHANTRIC;  Surgeon: [name], MD;  Location: [facility];  Service: Orthopedics;  Laterality: Left;   Family History  Problem Relation Age of Onset  . Hypertension     History  Substance Use Topics  . Smoking status: Never Smoker   . Smokeless tobacco: Never Used  . Alcohol Use: No   OB History    No data available     Review of Systems  Constitutional: Positive for activity change and appetite change. Negative for fever, chills, diaphoresis and fatigue.  HENT: Positive for trouble swallowing. Negative for mouth sores and sore throat.   Eyes: Negative for visual disturbance.  Respiratory: Negative for cough, chest tightness, shortness of breath and wheezing.   Cardiovascular: Negative  for chest pain.  Gastrointestinal: Positive for constipation. Negative for nausea, vomiting, abdominal pain, diarrhea and abdominal distention.  Endocrine: Negative for polydipsia, polyphagia and polyuria.  Genitourinary: Negative for dysuria, frequency and hematuria.  Musculoskeletal: Negative for gait problem.  Skin: Negative for color change, pallor and rash.  Neurological: Positive for weakness. Negative for dizziness, syncope, light-headedness and headaches.  Hematological: Does not bruise/bleed easily.  Psychiatric/Behavioral: Negative for behavioral problems and confusion.      Allergies  Review of patient's allergies indicates no known allergies.  Home Medications   Prior to Admission medications   Medication Sig Start Date End Date Taking? Authorizing Provider  amLODipine (NORVASC) 10 MG tablet Take 10 mg by mouth daily.    Yes Historical Provider, MD  feeding supplement, ENSURE COMPLETE, (ENSURE COMPLETE) LIQD Take 237 mLs by mouth 2 (two) times daily between meals. 10/31/14  Yes Donne Hazel, MD  hydrocortisone (ANUSOL-HC) 2.5 % rectal cream Place rectally 2 (two) times daily. 10/31/14  Yes Donne Hazel, MD  polyethylene glycol [facility] / Floria Raveling) packet Take 17 g by mouth daily. 10/31/14  Yes Donne Hazel, MD  aspirin EC 325 MG tablet Take 1 tablet (325 mg total) by mouth 2 (two) times daily. Patient not taking: Reported on 12/10/2014 03/06/14   Leighton Parody, PA-C  ciprofloxacin (CIPRO) 100 MG tablet Take 5 tablets (500 mg total) by mouth 2 (two) times daily. Patient not taking: Reported on 12/02/2014  10/31/14   Donne Hazel, MD  docusate sodium (COLACE) 100 MG capsule Take 1 capsule (100 mg total) by mouth 2 (two) times daily. Patient not taking: Reported on 12/02/2014 10/31/14   Donne Hazel, MD  metroNIDAZOLE (FLAGYL) 500 MG tablet Take 1 tablet (500 mg total) by mouth 3 (three) times daily. Patient not taking: Reported on 12/02/2014 10/31/14   Donne Hazel, MD   senna-docusate (SENOKOT-S) 8.6-50 MG per tablet Take 1 tablet by mouth 2 (two) times daily. Patient not taking: Reported on 12/20/2014 12/10/14   Virgel Manifold, MD   BP 103/66 mmHg  Pulse 115  Temp(Src) 98.8 F (37.1 C) (Oral)  Resp 21  SpO2 98% Physical Exam  Constitutional: She appears cachectic. She appears ill. No distress.  Week emaciated cachectic-appearing female. However lucid and able to answer questions.  HENT:  Diffuse head and neck muscle wasting. Dry mouth. Thrush in the oropharynx. Conjunctiva not pale. No scleral icterus.  Eyes:  Symmetric reactive pupils. Full extraocular movements.  Neck:  Marked muscle wasting of the head and neck. No JVD.  Cardiovascular:  Regular rhythm.  Pulmonary/Chest:  Clear symmetric breath sounds.  Abdominal:  Soft scaphoid. Nondistended. Nontender. Normal bowel sounds. Rectal soft stool, no impaction. Guaiac-negative.  Musculoskeletal:  Diffuse muscle wasting.  Neurological: She is alert.  Moves all 4 extremities without difficulty. No drift.  Skin:  No skin breakdown.    ED Course  Procedures (including critical care time) Labs Review Labs Reviewed  COMPREHENSIVE METABOLIC PANEL - Abnormal; Notable for the following:    Sodium 159 (*)    Chloride 123 (*)    Glucose, Bld 108 (*)    BUN 114 (*)    Creatinine, Ser 2.45 (*)    Calcium 10.6 (*)    Albumin 2.6 (*)    GFR calc non Af Amer 16 (*)    GFR calc Af Amer 19 (*)    All other components within normal limits  URINALYSIS, ROUTINE W REFLEX MICROSCOPIC - Abnormal; Notable for the following:    Color, Urine BROWN (*)    APPearance TURBID (*)    Hgb urine dipstick LARGE (*)    Bilirubin Urine SMALL (*)    Ketones, ur 15 (*)    Protein, ur >300 (*)    Leukocytes, UA MODERATE (*)    All other components within normal limits  CBC WITH DIFFERENTIAL/PLATELET - Abnormal; Notable for the following:    WBC 22.2 (*)    RBC 3.54 (*)    Hemoglobin 9.5 (*)    HCT 31.3 (*)     Neutrophils Relative % 88 (*)    Neutro Abs 19.6 (*)    Lymphocytes Relative 5 (*)    Monocytes Absolute 1.4 (*)    All other components within normal limits  URINE MICROSCOPIC-ADD ON - Abnormal; Notable for the following:    Bacteria, UA MANY (*)    Crystals CA OXALATE CRYSTALS (*)    All other components within normal limits  URINE CULTURE  LACTIC ACID, PLASMA  I-STAT TROPOININ, ED    Imaging Review Dg Chest 1 View  12/20/2014   CLINICAL DATA:  Difficulty swallowing, shortness of breath, and weakness  EXAM: CHEST  1 VIEW  COMPARISON:  10/29/2014  FINDINGS: No cardiomegaly. Unusual appearance of the left lower mediastinum which may be from leftward rotation. Rotation also accentuates aortic tortuosity. Pulmonary hyperinflation. There is no edema, consolidation, effusion, or pneumothorax. A nipple shadow is noted at the right base.  IMPRESSION: No edema or pneumonia.   Electronically Signed   By: Monte Fantasia M.D.   On: 12/20/2014 10:55   Ct Head Wo Contrast  12/20/2014   CLINICAL DATA:  Weakness and slurred speech since last night.  EXAM: CT HEAD WITHOUT CONTRAST  TECHNIQUE: Contiguous axial images were obtained from the base of the skull through the vertex without intravenous contrast.  COMPARISON:  10/25/2014  FINDINGS: Skull and Sinuses:Negative for fracture or destructive process. No sinus effusion.  Orbits: No acute abnormality.  Brain: No evidence of acute infarction, hemorrhage, hydrocephalus, or mass lesion/mass effect.  There is moderate generalized brain atrophy which is similar prior, with chronic small vessel ischemic gliosis around the lateral ventricles. Re- demonstrated remote lacunar infarct in the left caudate nucleus. There is a small, remote left parietal cortex infarct.  IMPRESSION: 1. No evidence of acute intracranial disease. 2. Senescent and remote ischemic changes are noted above.   Electronically Signed   By: Monte Fantasia M.D.   On: 12/20/2014 13:30     EKG  Interpretation None      MDM   Final diagnoses:  Difficulty swallowing  UTI (lower urinary tract infection)  Acute kidney injury    Patient started on IV fluids. Chest x-ray shows no abnormalities or acute changes or signs of aspiration. Urine appears thick and turbid and quite infected. After cultures was given IV antibiotics. Care discussed with hospitalist. Patient to be admitted.    Tanna Furry, MD 12/20/14 [phone]

## 2014-12-20 NOTE — H&P (Signed)
[facility] History and Physical  PAMMY VESEY KDX:[id number] DOB: 02-19-1921 DOA: 12/20/2014  Referring physician:  PCP: [name], MD   Chief Complaint: Functional decline  HPI: Jacqueline Huber is a 81 y.o. female with a past medical history of cognitive impairment, hypertension, history of hip fracture, presenting to the emergency department having a functional decline over the past 2 weeks. Issue and is minimally verbal as history was obtained from her daughter who was present at bedside. Her daughter reporting that patient was seen about 2 weeks ago at [facility] at which time she underwent disimpaction. She has had a progressive decline in function over the past 2 weeks, becoming increasingly weak, having minimal by mouth intake, spending the majority of the day in bed, now requiring assistance with all activities of daily living including feeding. Her daughter reports that prior to that she was able to ambulate around her home with a walker and feed herself. Workup in the emergency department revealed a UA showing many bacteria. Chem-7 showed the development of acute kidney injury with a creatinine of 2.45, elevated from 0.81 on 12/02/2014. She was administered IV fluids and antibiotic therapy with ceftriaxone.                                                                 Review of Systems:  Could not obtain a reliable review of systems  Past Medical History  Diagnosis Date  . Hypertension   . Cancer 1960s    throat cancer  . Constipation    Past Surgical History  Procedure Laterality Date  . Throat cancer surgery  1960's  . Intramedullary (im) nail intertrochanteric Left 03/03/2014    Procedure: INTRAMEDULLARY (IM) NAIL INTERTROCHANTRIC;  Surgeon: [name], MD;  Location: [facility];  Service: Orthopedics;  Laterality: Left;   Social History:  reports that she has never smoked. She has never used smokeless tobacco. She reports that she does not drink  alcohol or use illicit drugs.  No Known Allergies  Family History  Problem Relation Age of Onset  . Hypertension      Prior to Admission medications   Medication Sig Start Date End Date Taking? Authorizing Provider  amLODipine (NORVASC) 10 MG tablet Take 10 mg by mouth daily.    Yes Historical Provider, MD  feeding supplement, ENSURE COMPLETE, (ENSURE COMPLETE) LIQD Take 237 mLs by mouth 2 (two) times daily between meals. 10/31/14  Yes Donne Hazel, MD  hydrocortisone (ANUSOL-HC) 2.5 % rectal cream Place rectally 2 (two) times daily. 10/31/14  Yes Donne Hazel, MD  polyethylene glycol [facility] / Floria Raveling) packet Take 17 g by mouth daily. 10/31/14  Yes Donne Hazel, MD  aspirin EC 325 MG tablet Take 1 tablet (325 mg total) by mouth 2 (two) times daily. Patient not taking: Reported on 12/10/2014 03/06/14   Leighton Parody, PA-C  ciprofloxacin (CIPRO) 100 MG tablet Take 5 tablets (500 mg total) by mouth 2 (two) times daily. Patient not taking: Reported on 12/02/2014 10/31/14   Donne Hazel, MD  docusate sodium (COLACE) 100 MG capsule Take 1 capsule (100 mg total) by mouth 2 (two) times daily. Patient not taking: Reported on 12/02/2014 10/31/14   Donne Hazel, MD  metroNIDAZOLE (FLAGYL) 500 MG  tablet Take 1 tablet (500 mg total) by mouth 3 (three) times daily. Patient not taking: Reported on 12/02/2014 10/31/14   Donne Hazel, MD  senna-docusate (SENOKOT-S) 8.6-50 MG per tablet Take 1 tablet by mouth 2 (two) times daily. Patient not taking: Reported on 12/20/2014 12/10/14   Virgel Manifold, MD   Physical Exam: Filed Vitals:   12/20/14 1200 12/20/14 1215 12/20/14 1230 12/20/14 1333  BP: 124/57 115/78 100/67 103/66  Pulse: 117 114 114 115  Temp:    98.8 F (37.1 C)  TempSrc:    Oral  Resp: 21 20 22 21   SpO2: 99% 99% 100% 98%    Wt Readings from Last 3 Encounters:  10/29/14 37.195 kg (82 lb)  06/10/14 38.556 kg (85 lb)  04/03/14 43.092 kg (95 lb)    General:  Ill appearing,  cachectic, minimally verbal, severe muscle atrophy Eyes: PERRL, normal lids, irises & conjunctiva ENT: grossly normal hearing, lips & tongue Neck: no LAD, masses or thyromegaly Cardiovascular: Tachycardic, RRR, no m/r/g. No LE edema. Telemetry: SR, no arrhythmias  Respiratory: CTA bilaterally, no w/r/r. Normal respiratory effort. Abdomen: soft, has pulsatile mass over epigastric region. Has mild lower abdominal pain to palpation. Skin: no rash or induration seen on limited exam Musculoskeletal: Significant bilateral muscle atrophy Psychiatric: Patient is minimally verbal, unable to provide history. She was able to follow a few simple commands. Neurologic: grossly non-focal.          Labs on Admission:  Basic Metabolic Panel:  Recent Labs Lab 12/20/14 1217  NA 159*  K 5.1  CL 123*  CO2 25  GLUCOSE 108*  BUN 114*  CREATININE 2.45*  CALCIUM 10.6*   Liver Function Tests:  Recent Labs Lab 12/20/14 1217  AST 27  ALT 27  ALKPHOS 80  BILITOT 0.6  PROT 7.9  ALBUMIN 2.6*   No results for input(s): LIPASE, AMYLASE in the last 168 hours. No results for input(s): AMMONIA in the last 168 hours. CBC:  Recent Labs Lab 12/20/14 1217  WBC 22.2*  NEUTROABS 19.6*  HGB 9.5*  HCT 31.3*  MCV 88.4  PLT 378   Cardiac Enzymes: No results for input(s): CKTOTAL, CKMB, CKMBINDEX, TROPONINI in the last 168 hours.  BNP (last 3 results) No results for input(s): BNP in the last 8760 hours.  ProBNP (last 3 results) No results for input(s): PROBNP in the last 8760 hours.  CBG: No results for input(s): GLUCAP in the last 168 hours.  Radiological Exams on Admission: Dg Chest 1 View  12/20/2014   CLINICAL DATA:  Difficulty swallowing, shortness of breath, and weakness  EXAM: CHEST  1 VIEW  COMPARISON:  10/29/2014  FINDINGS: No cardiomegaly. Unusual appearance of the left lower mediastinum which may be from leftward rotation. Rotation also accentuates aortic tortuosity. Pulmonary  hyperinflation. There is no edema, consolidation, effusion, or pneumothorax. A nipple shadow is noted at the right base.  IMPRESSION: No edema or pneumonia.   Electronically Signed   By: Monte Fantasia M.D.   On: 12/20/2014 10:55   Ct Head Wo Contrast  12/20/2014   CLINICAL DATA:  Weakness and slurred speech since last night.  EXAM: CT HEAD WITHOUT CONTRAST  TECHNIQUE: Contiguous axial images were obtained from the base of the skull through the vertex without intravenous contrast.  COMPARISON:  10/25/2014  FINDINGS: Skull and Sinuses:Negative for fracture or destructive process. No sinus effusion.  Orbits: No acute abnormality.  Brain: No evidence of acute infarction, hemorrhage, hydrocephalus, or mass lesion/mass effect.  There is moderate generalized brain atrophy which is similar prior, with chronic small vessel ischemic gliosis around the lateral ventricles. Re- demonstrated remote lacunar infarct in the left caudate nucleus. There is a small, remote left parietal cortex infarct.  IMPRESSION: 1. No evidence of acute intracranial disease. 2. Senescent and remote ischemic changes are noted above.   Electronically Signed   By: Monte Fantasia M.D.   On: 12/20/2014 13:30    EKG: Independently reviewed.   Assessment/Plan Principal Problem:   Sepsis Active Problems:   AKI (acute kidney injury)   Essential hypertension   Leukocytosis   UTI (lower urinary tract infection)   FTT (failure to thrive) in adult   1. Sepsis, present on admission, evidenced by a white count of 22,200, development of acute kidney injury having creatinine of 2.45, heart rate of 118. Suspect source of infection to be urinary tract infection given urinalysis findings. Will initiate empiric IV antibiotic therapy with ceftriaxone 1 g every 24 hours, follow-up on urine cultures, provide IV fluids, and supportive care. 2. Acute kidney injury. Initial labs showing a creatinine of 2.45, increased from 0.81 from 12/02/2014. Her BUN  is also elevated at 114. I suspect secondary to prerenal azotemia/profound hypovolemia that likely resulted from patient's minimal by mouth intake over the past several weeks. She was started on IV fluids in the ER, will continue normal saline at 100 mL/hour.  3. Hypernatremia. Labs showing a sodium of 159 likely related to profound dehydration. Will provide volume resuscitation with normal saline, repeat BMP in a.m. 4. Failure to thrive/functional decline. Likely reciprocated underlying infectious process along with profound dehydration as initial labs pointing to urinary tract infection as a source. Patient will be admitted to the hospital where she will receive IV fluids and IV antimicrobial therapy. Consult physical therapy.  5. Hypertension. Will hold amlodipine as her blood pressures were low in the emergency room, having last blood pressure 103/66. 6. Constipation. I suspect that calcium channel blocker and dehydration may be contributing to her constipation. Providing IV fluids, stopped calcium channel blocker, will provide bowel regimen. 7. Protein calorie malnutrition. Labs showing albumin of 2.6, will provide Ensure 3 times a day 8. DVT prophylaxis. Simultaneous heparin    Code Status: CODE STATUS discussed with her daughter, she is a DO NOT RESUSCITATE  Family Communication: Spoke with her daughter present at bedside  Disposition Plan: anticipate patient requiring greater than 2 nights hospitalization   Time spent: 37 min  Kelvin Cellar [facility] Pager [phone]

## 2014-12-20 NOTE — Progress Notes (Signed)
MD has been paged about critical value.

## 2014-12-20 NOTE — Progress Notes (Signed)
Jacqueline Huber [id number] Admission Data: 12/20/2014 5:53 PM Attending Provider: Kelvin Cellar, MD  FFM:[id number],[id number], MD Consults/ Treatment Team:    Jacqueline Huber is a 81 y.o. female patient admitted from ED awake, alert  & orientated  X 2,  DNR, VSS - Blood pressure 108/66, pulse 108, temperature 98.4 F (36.9 C), temperature source Oral, resp. rate 20, SpO2 99 %., O2    , no c/o shortness of breath, no c/o chest pain, no distress noted. Tele # 9 placed and pt is currently running:   IV site WDL:  Left arm, running NS at 162ml/hour.  Allergies:  No Known Allergies   Past Medical History  Diagnosis Date  . Hypertension   . Cancer 1960s    throat cancer  . Constipation       Pt orientation to unit, room and routine. Information packet given to patient/family and safety video watched.  Admission INP armband ID verified with patient/family, and in place. SR up x 2, fall risk assessment complete with Patient and family verbalizing understanding of risks associated with falls. Pt verbalizes an understanding of how to use the call bell and to call for help before getting out of bed.  Skin, clean-dry- intact without evidence of bruising, or skin tears.   No evidence of skin break down noted on exam.    Will cont to monitor and assist as needed.  Dayle Points, RN 12/20/2014 5:53 PM

## 2014-12-20 NOTE — ED Notes (Addendum)
To ED from home via [facility] with family stating that pt became weaker and had slurred speech last night. On arrival pt is alert/oriented x 3-- does not know what month or day it is, does know who the president is. Pt is emaciated, family (daughter and granddaughter) at bedside. States that she has had a difficult time swallowing last night, with a choking episode. Had an impaction last week-- and has not been out of bed since, has white coating on tongue- family stated that just started.

## 2014-12-21 LAB — BASIC METABOLIC PANEL
BUN: 108 mg/dL — AB (ref 6–23)
BUN: 102 mg/dL — ABNORMAL HIGH (ref 6–23)
CALCIUM: 9.7 mg/dL (ref 8.4–10.5)
CO2: 21 mmol/L (ref 19–32)
CO2: 19 mmol/L (ref 19–32)
CREATININE: 1.95 mg/dL — AB (ref 0.50–1.10)
CREATININE: 1.62 mg/dL — AB (ref 0.50–1.10)
Calcium: 9.6 mg/dL (ref 8.4–10.5)
GFR calc Af Amer: 25 mL/min — ABNORMAL LOW (ref >90)
GFR calc Af Amer: 31 mL/min — ABNORMAL LOW (ref >90)
GFR calc non Af Amer: 21 mL/min — ABNORMAL LOW (ref >90)
GFR calc non Af Amer: 27 mL/min — ABNORMAL LOW (ref >90)
Glucose, Bld: 83 mg/dL (ref 70–99)
Glucose, Bld: 114 mg/dL — ABNORMAL HIGH (ref 70–99)
Potassium: 4.5 mmol/L (ref 3.5–5.1)
Potassium: 4 mmol/L (ref 3.5–5.1)
Sodium: 162 mmol/L (ref 135–145)
Sodium: 158 mmol/L — ABNORMAL HIGH (ref 135–145)

## 2014-12-21 LAB — CBC
HCT: 28.9 % — ABNORMAL LOW (ref 36.0–46.0)
HEMOGLOBIN: 8.6 g/dL — AB (ref 12.0–15.0)
MCH: 27.1 pg (ref 26.0–34.0)
MCHC: 29.8 g/dL — ABNORMAL LOW (ref 30.0–36.0)
MCV: 91.2 fL (ref 78.0–100.0)
PLATELETS: 370 10*3/uL (ref 150–400)
RBC: 3.17 MIL/uL — AB (ref 3.87–5.11)
RDW: 15.6 % — ABNORMAL HIGH (ref 11.5–15.5)
WBC: 22.9 10*3/uL — ABNORMAL HIGH (ref 4.0–10.5)

## 2014-12-21 LAB — PROCALCITONIN: Procalcitonin: 1.87 ng/mL

## 2014-12-21 MED ORDER — NYSTATIN 100000 UNIT/ML MT SUSP
5.0000 mL | Freq: Four times a day (QID) | OROMUCOSAL | Status: DC
  Administered 2014-12-21 – 2014-12-25 (×16): 500000 [IU] via ORAL
  Filled 2014-12-21 (×19): qty 5

## 2014-12-21 MED ORDER — FLUCONAZOLE 200 MG PO TABS
200.0000 mg | ORAL_TABLET | Freq: Every day | ORAL | Status: DC
  Administered 2014-12-21 – 2014-12-24 (×3): 200 mg via ORAL
  Filled 2014-12-21 (×4): qty 1

## 2014-12-21 MED ORDER — PIPERACILLIN-TAZOBACTAM 3.375 G IVPB
3.3750 g | Freq: Three times a day (TID) | INTRAVENOUS | Status: DC
  Administered 2014-12-21 – 2014-12-23 (×6): 3.375 g via INTRAVENOUS
  Filled 2014-12-21 (×8): qty 50

## 2014-12-21 MED ORDER — DEXTROSE 5 % IV SOLN
INTRAVENOUS | Status: DC
  Administered 2014-12-21: 1000 mL via INTRAVENOUS
  Administered 2014-12-22 – 2014-12-23 (×3): via INTRAVENOUS
  Administered 2014-12-24: 1000 mL via INTRAVENOUS

## 2014-12-21 NOTE — Progress Notes (Signed)
CRITICAL VALUE ALERT  Critical value received:  Sodium 162, Chloride greater than 130   Date of notification:  12/21/14  Time of notification:  1025am  Critical value read back:Yes.    Nurse who received alert:  Jenny Reichmann RN   MD notified (1st page):  Karleen Hampshire MD (verbal)  Time of first page:  1028am  MD notified (2nd page):  Time of second page:  Responding MD:  Karleen Hampshire   Time MD responded:  1028am

## 2014-12-21 NOTE — Evaluation (Signed)
Clinical/Bedside Swallow Evaluation Patient Details  Name: Jacqueline Huber MRN: [id number] Date of Birth: 1921/02/18  Today's Date: 12/21/2014 Time: SLP Start Time (ACUTE ONLY): 1627 SLP Stop Time (ACUTE ONLY): 1651 SLP Time Calculation (min) (ACUTE ONLY): 24 min  Past Medical History:  Past Medical History  Diagnosis Date  . Hypertension   . Constipation   . Throat cancer 1960s  . Arthritis     "knees" (12/20/2014)  . Depression    Past Surgical History:  Past Surgical History  Procedure Laterality Date  . Throat surgery  1960's    "for cancer"  . Intramedullary (im) nail intertrochanteric Left 03/03/2014    Procedure: INTRAMEDULLARY (IM) NAIL INTERTROCHANTRIC;  Surgeon: [name], MD;  Location: [facility];  Service: Orthopedics;  Laterality: Left;  . Tonsillectomy  1960's  . Cataract extraction w/ intraocular lens  implant, bilateral Bilateral   . Fracture surgery     HPI:  Jacqueline Huber is a 81 y.o. female with a past medical history of cognitive impairment, hypertension, history of hip fracture, presenting to the emergency department having a functional decline over the past 2 weeks with progressive weakness   Assessment / Plan / Recommendation Clinical Impression  Pt exhibits a cognitively-based oral dysphagia with oral holding and decreased bolus awareness. SLP provided Max cues to initiate oral transit and to utilize liquid wash to clear lingual residuals from pureed boluses. Per family report, patient did not have any difficulty with swallowing prior to two weeks ago when overall functional decline began. Hopeful that with medical treatment for current infection, pt's mentation will improve and she will be able to resume more solid textures. For now, recommend Dys 1 diet and thin liquids. Will follow for tolerance and upgrades.    Aspiration Risk  Moderate    Diet Recommendation Dysphagia 1 (Puree);Thin liquid   Liquid Administration via: Cup;Straw Medication  Administration: Crushed with puree Supervision: Patient able to self feed;Full supervision/cueing for compensatory strategies Compensations: Slow rate;Small sips/bites;Check for pocketing Postural Changes and/or Swallow Maneuvers: Seated upright 90 degrees    Other  Recommendations Oral Care Recommendations: Oral care BID   Follow Up Recommendations   (tba)    Frequency and Duration min 2x/week  2 weeks   Pertinent Vitals/Pain n/a    SLP Swallow Goals     Swallow Study Prior Functional Status  Type of Home: House Available Help at Discharge: Family;Available PRN/intermittently    General Date of Onset:  (~2 weeks ago) HPI: Jacqueline Huber is a 81 y.o. female with a past medical history of cognitive impairment, hypertension, history of hip fracture, presenting to the emergency department having a functional decline over the past 2 weeks with progressive weakness Type of Study: Bedside swallow evaluation Previous Swallow Assessment: none in chart Diet Prior to this Study: Regular;Thin liquids Temperature Spikes Noted: No Respiratory Status: Room air History of Recent Intubation: No Behavior/Cognition: Alert;Cooperative;Pleasant mood;Requires cueing Self-Feeding Abilities: Able to feed self Patient Positioning: Upright in bed Baseline Vocal Quality: Clear    Oral/Motor/Sensory Function Overall Oral Motor/Sensory Function: Appears within functional limits for tasks assessed   Ice Chips Ice chips: Not tested   Thin Liquid Thin Liquid: Impaired Presentation: Self Fed;Straw Oral Phase Impairments: Poor awareness of bolus Oral Phase Functional Implications: Oral holding    Nectar Thick Nectar Thick Liquid: Not tested   Honey Thick Honey Thick Liquid: Not tested   Puree Puree: Impaired Presentation: Self Fed;Spoon Oral Phase Impairments: Impaired anterior to posterior transit;Poor awareness of  bolus Oral Phase Functional Implications: Oral residue;Oral holding   Solid       Solid: Not tested      Germain Osgood, M.A. CCC-SLP [phone]  Germain Osgood 12/21/2014,5:06 PM

## 2014-12-21 NOTE — Progress Notes (Signed)
[facility] PROGRESS NOTE  Jacqueline Huber CZY:[id number] DOB: February 21, 1921 DOA: 12/20/2014 PCP: [name], MD Interim summary: 81 y.o. Female with prior h/o dementia, constipation, hypertension,  recently discharged from [facility] comes in today for generalized weakness, functional decline and she was found to be in acute renal failure, hypernatremia, urinary tract infection.  Assessment/Plan: 1. Leukocytosis and urinary tract infection: Initially started on rocephin and later on changed to zosyn after blood cultures came positive for gram neg rods. Urine cultures are pending.  She is currently afebrile but has high leukocytosis.     Hypernatremia: Probably from free water deficit. Recommend free water intake orally and started her on dextrose water fluids.  Her free water deficit probably around 4 liters.  recommend checking BMP tonight and tomorrow.   Anemia:  Normocytic. Slight drop from admission. Continue to monitor.    Elevated lactic acid probably from infection and acute renal failure.    Difficulty swallowing and pain during swallowing: ? Thrush and swallow eval ordered.      Code Status: DNR Family Communication: daughter at bedside Disposition Plan: pending.    Consultants: none  Procedures:  none  Antibiotics:  Zosyn 2/18.   HPI/Subjective: She is minimally conversative, daughter at bedside.   Objective: Filed Vitals:   12/21/14 1523  BP: 114/64  Pulse: 94  Temp: 97.5 F (36.4 C)  Resp: 16    Intake/Output Summary (Last 24 hours) at 12/21/14 1907 Last data filed at 12/21/14 1819  Gross per 24 hour  Intake 1023.67 ml  Output    700 ml  Net 323.67 ml   There were no vitals filed for this visit.  Exam:   General:  Alert not in any distress  Cardiovascular: s1s2,   Respiratory: clear to auscultation,no wheezing heard.   Abdomen: soft non tender non distended bowel sounds heard.   Musculoskeletal: no pedal edema.   Data  Reviewed: Basic Metabolic Panel:  Recent Labs Lab 12/20/14 1217 12/21/14 0841  NA 159* 162*  K 5.1 4.5  CL 123* >130*  CO2 25 21  GLUCOSE 108* 83  BUN 114* 108*  CREATININE 2.45* 1.95*  CALCIUM 10.6* 9.6   Liver Function Tests:  Recent Labs Lab 12/20/14 1217  AST 27  ALT 27  ALKPHOS 80  BILITOT 0.6  PROT 7.9  ALBUMIN 2.6*   No results for input(s): LIPASE, AMYLASE in the last 168 hours. No results for input(s): AMMONIA in the last 168 hours. CBC:  Recent Labs Lab 12/20/14 1217 12/21/14 0841  WBC 22.2* 22.9*  NEUTROABS 19.6*  --   HGB 9.5* 8.6*  HCT 31.3* 28.9*  MCV 88.4 91.2  PLT 378 370   Cardiac Enzymes: No results for input(s): CKTOTAL, CKMB, CKMBINDEX, TROPONINI in the last 168 hours. BNP (last 3 results) No results for input(s): BNP in the last 8760 hours.  ProBNP (last 3 results) No results for input(s): PROBNP in the last 8760 hours.  CBG: No results for input(s): GLUCAP in the last 168 hours.  Recent Results (from the past 240 hour(s))  Urine culture     Status: None (Preliminary result)   Collection Time: 12/20/14 11:27 AM  Result Value Ref Range Status   Specimen Description URINE, CLEAN CATCH  Final   Special Requests NONE  Final   Colony Count PENDING  Incomplete   Culture   Final    Culture reincubated for better growth Performed at [facility]    Report Status PENDING  Incomplete  Culture, blood (  routine x 2)     Status: None (Preliminary result)   Collection Time: 12/20/14  4:48 PM  Result Value Ref Range Status   Specimen Description BLOOD RIGHT ARM  Final   Special Requests BOTTLES DRAWN AEROBIC AND ANAEROBIC 5CC EACH  Final   Culture   Final    GRAM NEGATIVE RODS Note: Gram Stain Report Called to,Read Back By and Verified With: SCOTT G RN 12/21/14 AT 1300 BY [facility] Performed at [facility]    Report Status PENDING  Incomplete     Studies: Dg Chest 1 View  12/20/2014   CLINICAL DATA:  Difficulty  swallowing, shortness of breath, and weakness  EXAM: CHEST  1 VIEW  COMPARISON:  10/29/2014  FINDINGS: No cardiomegaly. Unusual appearance of the left lower mediastinum which may be from leftward rotation. Rotation also accentuates aortic tortuosity. Pulmonary hyperinflation. There is no edema, consolidation, effusion, or pneumothorax. A nipple shadow is noted at the right base.  IMPRESSION: No edema or pneumonia.   Electronically Signed   By: Monte Fantasia M.D.   On: 12/20/2014 10:55   Ct Head Wo Contrast  12/20/2014   CLINICAL DATA:  Weakness and slurred speech since last night.  EXAM: CT HEAD WITHOUT CONTRAST  TECHNIQUE: Contiguous axial images were obtained from the base of the skull through the vertex without intravenous contrast.  COMPARISON:  10/25/2014  FINDINGS: Skull and Sinuses:Negative for fracture or destructive process. No sinus effusion.  Orbits: No acute abnormality.  Brain: No evidence of acute infarction, hemorrhage, hydrocephalus, or mass lesion/mass effect.  There is moderate generalized brain atrophy which is similar prior, with chronic small vessel ischemic gliosis around the lateral ventricles. Re- demonstrated remote lacunar infarct in the left caudate nucleus. There is a small, remote left parietal cortex infarct.  IMPRESSION: 1. No evidence of acute intracranial disease. 2. Senescent and remote ischemic changes are noted above.   Electronically Signed   By: Monte Fantasia M.D.   On: 12/20/2014 13:30    Scheduled Meds: . docusate sodium  100 mg Oral BID  . feeding supplement (ENSURE COMPLETE)  237 mL Oral TID WC  . fluconazole  200 mg Oral Daily  . heparin  5,000 Units Subcutaneous 3 times per day  . nystatin  5 mL Oral QID  . piperacillin-tazobactam (ZOSYN)  IV  3.375 g Intravenous Q8H  . polyethylene glycol  17 g Oral Daily  . sodium chloride  3 mL Intravenous Q12H   Continuous Infusions: . dextrose 1,000 mL (12/21/14 1148)    Principal Problem:   Sepsis Active  Problems:   Essential hypertension   Leukocytosis   UTI (lower urinary tract infection)   AKI (acute kidney injury)   FTT (failure to thrive) in adult    Time spent: 62 min    [facility] Pager [phone]. If 7PM-7AM, please contact night-coverage at [email or web address], password [facility] 12/21/2014, 7:07 PM  LOS: 1 day

## 2014-12-21 NOTE — Progress Notes (Signed)
ANTIBIOTIC CONSULT NOTE - INITIAL  Pharmacy Consult for Zosyn Indication: GNR bacteremia  No Known Allergies  Patient Measurements:    Vital Signs: Temp: 97.5 F (36.4 C) (02/18 1523) Temp Source: Oral (02/18 1523) BP: 114/64 mmHg (02/18 1523) Pulse Rate: 94 (02/18 1523) Intake/Output from previous day: 02/17 0701 - 02/18 0700 In: 50 [IV Piggyback:50] Out: -  Intake/Output from this shift: Total I/O In: 322 [P.O.:322] Out: 700 [Urine:700]  Labs:  Recent Labs  12/20/14 1217 12/21/14 0841  WBC 22.2* 22.9*  HGB 9.5* 8.6*  PLT 378 370  CREATININE 2.45* 1.95*   CrCl cannot be calculated (Unknown ideal weight.). No results for input(s): VANCOTROUGH, VANCOPEAK, VANCORANDOM, GENTTROUGH, GENTPEAK, GENTRANDOM, TOBRATROUGH, TOBRAPEAK, TOBRARND, AMIKACINPEAK, AMIKACINTROU, AMIKACIN in the last 72 hours.   Microbiology: Recent Results (from the past 720 hour(s))  Urine culture     Status: None (Preliminary result)   Collection Time: 12/20/14 11:27 AM  Result Value Ref Range Status   Specimen Description URINE, CLEAN CATCH  Final   Special Requests NONE  Final   Colony Count PENDING  Incomplete   Culture   Final    Culture reincubated for better growth Performed at [facility]    Report Status PENDING  Incomplete  Culture, blood (routine x 2)     Status: None (Preliminary result)   Collection Time: 12/20/14  4:48 PM  Result Value Ref Range Status   Specimen Description BLOOD RIGHT ARM  Final   Special Requests BOTTLES DRAWN AEROBIC AND ANAEROBIC 5CC EACH  Final   Culture   Final    GRAM NEGATIVE RODS Note: Gram Stain Report Called to,Read Back By and Verified With: SCOTT G RN 12/21/14 AT 47 BY [facility] Performed at [facility]    Report Status PENDING  Incomplete    Medical History: Past Medical History  Diagnosis Date  . Hypertension   . Constipation   . Throat cancer 1960s  . Arthritis     "knees" (12/20/2014)  . Depression      Medications:  Prescriptions prior to admission  Medication Sig Dispense Refill Last Dose  . amLODipine (NORVASC) 10 MG tablet Take 10 mg by mouth daily.    12/19/2014 at Unknown time  . feeding supplement, ENSURE COMPLETE, (ENSURE COMPLETE) LIQD Take 237 mLs by mouth 2 (two) times daily between meals. 10 Bottle 0 12/19/2014 at Unknown time  . hydrocortisone (ANUSOL-HC) 2.5 % rectal cream Place rectally 2 (two) times daily. 30 g 0 Past Month at Unknown time  . polyethylene glycol (MIRALAX / GLYCOLAX) packet Take 17 g by mouth daily. 14 each 0 Past Month at Unknown time  . aspirin EC 325 MG tablet Take 1 tablet (325 mg total) by mouth 2 (two) times daily. (Patient not taking: Reported on 12/10/2014) 30 tablet 0 Not Taking at Unknown time  . ciprofloxacin (CIPRO) 100 MG tablet Take 5 tablets (500 mg total) by mouth 2 (two) times daily. (Patient not taking: Reported on 12/02/2014)   Completed Course at Unknown time  . docusate sodium (COLACE) 100 MG capsule Take 1 capsule (100 mg total) by mouth 2 (two) times daily. (Patient not taking: Reported on 12/02/2014) 10 capsule 0 Not Taking at Unknown time  . metroNIDAZOLE (FLAGYL) 500 MG tablet Take 1 tablet (500 mg total) by mouth 3 (three) times daily. (Patient not taking: Reported on 12/02/2014)   Not Taking at Unknown time  . senna-docusate (SENOKOT-S) 8.6-50 MG per tablet Take 1 tablet by mouth 2 (two)  times daily. (Patient not taking: Reported on 12/20/2014) 60 tablet 0 Not Taking at Unknown time   Scheduled:  . docusate sodium  100 mg Oral BID  . feeding supplement (ENSURE COMPLETE)  237 mL Oral TID WC  . fluconazole  200 mg Oral Daily  . heparin  5,000 Units Subcutaneous 3 times per day  . nystatin  5 mL Oral QID  . polyethylene glycol  17 g Oral Daily  . sodium chloride  3 mL Intravenous Q12H   Infusions:  . dextrose 1,000 mL (12/21/14 1148)   Assessment: 81yo female presents with weakness and FTT. Pharmacy is consulted to dose zosyn for gram  negative bacteremia. Pt is afebrile, WBC 22.9, sCr 1.95.  Goal of Therapy:  Eradication of infection  Plan:  Zosyn 3.375g IV q8h Follow up culture results, renal function, and clinical course  Andrey Cota. Diona Foley, PharmD Clinical Pharmacist Pager [phone] 12/21/2014,4:17 PM

## 2014-12-21 NOTE — Care Management Note (Signed)
    Page 1 of 1   12/26/2014     12:04:24 PM CARE MANAGEMENT NOTE 12/26/2014  Patient:  Jacqueline Huber, Jacqueline Huber   Account Number:  [id number]  Date Initiated:  12/21/2014  Documentation initiated by:  Tomi Bamberger  Subjective/Objective Assessment:   dx sepsis, aki, uti  admit- lives with daughter.     Action/Plan:   pt eval- rec snf.   Anticipated DC Date:  12/25/2014   Anticipated DC Plan:  SKILLED NURSING FACILITY  In-house referral  Clinical Social Worker      DC Planning Services  CM consult      Choice offered to / List presented to:             Status of service:  Completed, signed off Medicare Important Message given?  YES (If response is "NO", the following Medicare IM given date fields will be blank) Date Medicare IM given:  12/22/2014 Medicare IM given by:  Tomi Bamberger Date Additional Medicare IM given:  12/25/2014 Additional Medicare IM given by:  Tomi Bamberger  Discharge Disposition:  [place]  Per UR Regulation:  Reviewed for med. necessity/level of care/duration of stay  If discussed at [place] of Stay Meetings, dates discussed:    Comments:  12/26/14 [place] ,BSN [phone] patient dc to snf with palliative.  12/22/14 [place] BSN [phone] patient lives with daughter, NCM spoke with daughter about hospice at home, informed her that she would have a HHRN and an aide but they would only be there for 96mins to and hour at most, and the family would still be the support for care of the patient. NCM asked daughter if she would like to talk to the onsite liason, Lesleigh Noe regarding other questions she had , she stated yes.  NCM called Margie and she will be up to speak with patient in 30 minutes.  This was relayed to the daughter. Also a referral was given to [facility] this am regarding [place] papers, they stated they would be there to assist daughter.  12/1814 [place] BSN [phone] patient lives with daughter, per physical therapy recs SNF, CSW referral.

## 2014-12-21 NOTE — Progress Notes (Signed)
INITIAL NUTRITION ASSESSMENT  DOCUMENTATION CODES Per approved criteria  -Severe malnutrition in the context of chronic illness   Pt meets criteria for severe MALNUTRITION in the context of chronic illness as evidenced by severe fat and muscle depletion, <75% estimated energy intake x 1 month, 26% wt loss x 1 year.  INTERVENTION: -Continue Ensure Complete po TID, each supplement provides 350 kcal and 13 grams of protein -Magic Cup TID on meal trays  NUTRITION DIAGNOSIS: Malnutrition related to decreased oral intake as evidenced by severe fat and muscle depletion, 26% wt loss x 1 year.   Goal: Pt will meet >90% of estimated nutritional needs  Monitor:  PO/supplement intake, labs, weight changes, I/O's  Reason for Assessment: MST=3  81 y.o. female  Admitting Dx: Sepsis  Jacqueline Huber is a 81 y.o. female with a past medical history of cognitive impairment, hypertension, history of hip fracture, presenting to the emergency department having a functional decline over the past 2 weeks.  ASSESSMENT: Pt lethargic at time of visit. Hx obtained by pt daughter at bedside. Pt daughter reports general decline in health over the past year. She reports pt has lost a lot of weight, noting she last weighed her UBW of 100# around once year ago and is now 74# (26% wt loss x 1 year).  Daughter reports pt's nutritional intake continues to decline. She ate only a few bites of grits for breakfast. Additionally, daughter reveals that pt has had more difficulty Huber foods and liquids. She drinks Ensure at home, but not as often as she used to. Daughter reports concern about pt's lack of nutritional adequacy. She is agreeable to try Magic Cups to help maximize nutritional intake.  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 She is very familiar with the importance of good nutrition to promote healing.  Labs reviewed. Na: 162, Cl: <130, BUN/Creat: 108/1.95.   Nutrition Focused Physical  Exam:  Subcutaneous Fat:  Orbital Region: severe depletion Upper Arm Region: severe depletion Thoracic and Lumbar Region: severe depletion  Muscle:  Temple Region: severe depletion Clavicle Bone Region: severe depletion Clavicle and Acromion Bone Region: severe depletion Scapular Bone Region: severe depletion Dorsal Hand: severe depletion Patellar Region: severe depletion Anterior Thigh Region: severe depletion Posterior Calf Region: severe depletion  Edema: none present  Height: Ht Readings from Last 1 Encounters:  10/29/14 5\' 3"  (1.6 m)    Weight: Wt Readings from Last 1 Encounters:  10/29/14 82 lb (37.195 kg)  Per daughter, most recent wt of 74#  Ideal Body Weight: 115#  % Ideal Body Weight: 64%  Wt Readings from Last 10 Encounters:  10/29/14 82 lb (37.195 kg)  06/10/14 85 lb (38.556 kg)  04/03/14 95 lb (43.092 kg)  03/02/14 103 lb 9.9 oz (47 kg)    Usual Body Weight: 100#  % Usual Body Weight: 74%  BMI:  Estimated body mass index is 14.53 kg/(m^2) as calculated from the following:   Height as of 10/29/14: 5\' 3"  (1.6 m).   Weight as of 10/29/14: 82 lb (37.195 kg). Underweight  Estimated Nutritional Needs: Kcal: 1100-1300 Protein: 44-54 grams Fluid: 1.1-1.3 L  Skin: Intact  Diet Order: Diet regular  EDUCATION NEEDS: -No education needs identified at this time   Intake/Output Summary (Last 24 hours) at 12/21/14 1027 Last data filed at 12/21/14 0721  Gross per 24 hour  Intake     50 ml  Output    700 ml  Net   -650 ml  Last BM: PTA  Labs:   Recent Labs Lab 12/20/14 1217 12/21/14 0841  NA 159* 162*  K 5.1 4.5  CL 123* >130*  CO2 25 21  BUN 114* 108*  CREATININE 2.45* 1.95*  CALCIUM 10.6* 9.6  GLUCOSE 108* 83    CBG (last 3)  No results for input(s): GLUCAP in the last 72 hours.  Scheduled Meds: . cefTRIAXone (ROCEPHIN)  IV  1 g Intravenous Q24H  . docusate sodium  100 mg Oral BID  . feeding supplement (ENSURE COMPLETE)   237 mL Oral TID WC  . heparin  5,000 Units Subcutaneous 3 times per day  . polyethylene glycol  17 g Oral Daily  . sodium chloride  3 mL Intravenous Q12H    Continuous Infusions: . sodium chloride 100 mL/hr at 12/21/14 7092    Past Medical History  Diagnosis Date  . Hypertension   . Constipation   . Throat cancer 1960s  . Arthritis     "knees" (12/20/2014)  . Depression     Past Surgical History  Procedure Laterality Date  . Throat surgery  1960's    "for cancer"  . Intramedullary (im) nail intertrochanteric Left 03/03/2014    Procedure: INTRAMEDULLARY (IM) NAIL INTERTROCHANTRIC;  Surgeon: [name], MD;  Location: [facility] ORS;  Service: Orthopedics;  Laterality: Left;  . Tonsillectomy  1960's  . Cataract extraction w/ intraocular lens  implant, bilateral Bilateral   . Fracture surgery      Ebonique Hallstrom A. Jimmye Norman, RD, LDN, CDE Pager: [phone] After hours Pager: [phone]

## 2014-12-21 NOTE — Evaluation (Signed)
Physical Therapy Evaluation Patient Details Name: Jacqueline Huber MRN: [id number] DOB: 09/04/1921 Today's Date: 12/21/2014   History of Present Illness  Jacqueline Huber is a 81 y.o. female with a past medical history of cognitive impairment, hypertension, history of hip fracture, presenting to the emergency department having a functional decline over the past 2 weeks with progressive weakness  Clinical Impression  Pt admitted with/for functional decline/FTT.  Pt currently limited functionally due to the problems listed. ( See problems list.)   Pt will benefit from PT to maximize function and safety in order to get ready for next venue listed below.     Follow Up Recommendations SNF;Supervision/Assistance - 24 hour    Equipment Recommendations  None recommended by PT (TBA)    Recommendations for Other Services       Precautions / Restrictions Precautions Precautions: Fall      Mobility  Bed Mobility Overal bed mobility: Needs Assistance Bed Mobility: Supine to Sit;Sit to Supine     Supine to sit: Mod assist Sit to supine: Mod assist   General bed mobility comments: cues for guidance and truncal/LE assist  Transfers Overall transfer level: Needs assistance Equipment used: None Transfers: Sit to/from Stand Sit to Stand: Max assist         General transfer comment: lifting assist with face to face assist  Ambulation/Gait             General Gait Details: not test  Stairs            Wheelchair Mobility    Modified Rankin (Stroke Patients Only)       Balance Overall balance assessment: Needs assistance Sitting-balance support: Single extremity supported Sitting balance-Leahy Scale: Fair Sitting balance - Comments: can attain full upright sit, but needs assist with any challenge such as kicking out one or other leg.   Standing balance support: Bilateral upper extremity supported Standing balance-Leahy Scale: Poor                               Pertinent Vitals/Pain Pain Assessment: No/denies pain    Home Living Family/patient expects to be discharged to:: Private residence Living Arrangements: Children;Other (Comment) (5 other family members with her) Available Help at Discharge: Family;Available PRN/intermittently Type of Home: House Home Access: Level entry     Home Layout: Multi-level;Bed/bath upstairs Home Equipment: Walker - 2 wheels;Bedside commode Additional Comments: broke L hip in May    Prior Function Level of Independence: Independent with assistive device(s)         Comments: with RW     Hand Dominance   Dominant Hand: Right    Extremity/Trunk Assessment   Upper Extremity Assessment: Generalized weakness           Lower Extremity Assessment: Generalized weakness (quads bil are no better than 3/5)         Communication   Communication: No difficulties  Cognition Arousal/Alertness: Awake/alert Behavior During Therapy: WFL for tasks assessed/performed Overall Cognitive Status: Within Functional Limits for tasks assessed                      General Comments      Exercises        Assessment/Plan    PT Assessment Patient needs continued PT services  PT Diagnosis Generalized weakness   PT Problem List Decreased strength;Decreased activity tolerance;Decreased balance;Decreased mobility  PT Treatment Interventions Functional mobility training;Therapeutic activities;Therapeutic exercise;Balance training;Patient/family  education   PT Goals (Current goals can be found in the Care Plan section) Acute Rehab PT Goals Patient Stated Goal: Family hopes to build up her strength PT Goal Formulation: With patient/family Time For Goal Achievement: 01/04/15 Potential to Achieve Goals: Fair    Frequency Min 2X/week   Barriers to discharge        Co-evaluation               End of Session   Activity Tolerance: Patient limited by fatigue Patient left: in bed;with  call bell/phone within reach;with family/visitor present Nurse Communication: Mobility status         Time: 1300-1326 PT Time Calculation (min) (ACUTE ONLY): 26 min   Charges:   PT Evaluation $Initial PT Evaluation Tier I: 1 Procedure PT Treatments $Therapeutic Activity: 8-22 mins   PT G Codes:        Sumire Halbleib, Tessie Fass 12/21/2014, 1:44 PM 12/21/2014  Donnella Sham, PT [phone]  (pager)

## 2014-12-21 NOTE — Progress Notes (Signed)
Gram negative rods found in blood. Karleen Hampshire MD made aware.

## 2014-12-22 ENCOUNTER — Inpatient Hospital Stay (HOSPITAL_COMMUNITY): Payer: Medicare Other

## 2014-12-22 LAB — CBC
HEMATOCRIT: 30.5 % — AB (ref 36.0–46.0)
Hemoglobin: 9.5 g/dL — ABNORMAL LOW (ref 12.0–15.0)
MCH: 27.8 pg (ref 26.0–34.0)
MCHC: 31.1 g/dL (ref 30.0–36.0)
MCV: 89.2 fL (ref 78.0–100.0)
PLATELETS: 332 10*3/uL (ref 150–400)
RBC: 3.42 MIL/uL — AB (ref 3.87–5.11)
RDW: 15.6 % — AB (ref 11.5–15.5)
WBC: 16.9 10*3/uL — ABNORMAL HIGH (ref 4.0–10.5)

## 2014-12-22 LAB — URINE CULTURE: Colony Count: >=100000

## 2014-12-22 LAB — BASIC METABOLIC PANEL
ANION GAP: 13 (ref 5–15)
ANION GAP: 54 — AB (ref 5–15)
BUN: 93 mg/dL — ABNORMAL HIGH (ref 6–23)
BUN: 75 mg/dL — AB (ref 6–23)
CHLORIDE: 101 mmol/L (ref 96–112)
CO2: 20 mmol/L (ref 19–32)
CO2: 19 mmol/L (ref 19–32)
CREATININE: 1.46 mg/dL — AB (ref 0.50–1.10)
Calcium: 9.8 mg/dL (ref 8.4–10.5)
Calcium: 6.1 mg/dL — CL (ref 8.4–10.5)
Chloride: 125 mmol/L — ABNORMAL HIGH (ref 96–112)
Creatinine, Ser: 1.3 mg/dL — ABNORMAL HIGH (ref 0.50–1.10)
GFR calc Af Amer: 35 mL/min — ABNORMAL LOW (ref >90)
GFR calc Af Amer: 40 mL/min — ABNORMAL LOW (ref >90)
GFR calc non Af Amer: 30 mL/min — ABNORMAL LOW (ref >90)
GFR calc non Af Amer: 35 mL/min — ABNORMAL LOW (ref >90)
Glucose, Bld: 83 mg/dL (ref 70–99)
Glucose, Bld: 94 mg/dL (ref 70–99)
POTASSIUM: 4.4 mmol/L (ref 3.5–5.1)
POTASSIUM: 3.5 mmol/L (ref 3.5–5.1)
Sodium: 158 mmol/L — ABNORMAL HIGH (ref 135–145)
Sodium: 174 mmol/L (ref 135–145)

## 2014-12-22 MED ORDER — FREE WATER
200.0000 mL | Freq: Four times a day (QID) | Status: AC
  Administered 2014-12-22 – 2014-12-23 (×3): 200 mL

## 2014-12-22 MED ORDER — POLYETHYLENE GLYCOL 3350 17 G PO PACK
17.0000 g | PACK | Freq: Two times a day (BID) | ORAL | Status: DC
  Administered 2014-12-22 – 2014-12-25 (×5): 17 g via ORAL
  Filled 2014-12-22 (×7): qty 1

## 2014-12-22 MED ORDER — SENNOSIDES-DOCUSATE SODIUM 8.6-50 MG PO TABS
2.0000 | ORAL_TABLET | Freq: Two times a day (BID) | ORAL | Status: DC
  Administered 2014-12-22 – 2014-12-23 (×3): 2 via ORAL
  Filled 2014-12-22 (×4): qty 2

## 2014-12-22 MED ORDER — MAGNESIUM HYDROXIDE 400 MG/5ML PO SUSP
30.0000 mL | Freq: Every day | ORAL | Status: DC
  Administered 2014-12-22 – 2014-12-25 (×3): 30 mL via ORAL
  Filled 2014-12-22 (×4): qty 30

## 2014-12-22 NOTE — Progress Notes (Signed)
[facility] PROGRESS NOTE  Jacqueline Huber HMC:[id number] DOB: 01/03/1921 DOA: 12/20/2014 PCP: [name], MD Interim summary: 81 y.o. Female with prior h/o dementia, constipation, hypertension,  recently discharged from [facility] comes in today for generalized weakness, functional decline and she was found to be in acute renal failure, hypernatremia, urinary tract infection.  Assessment/Plan: 1. Leukocytosis and urinary tract infection: Initially started on rocephin and later on changed to zosyn after blood cultures came positive for gram neg rods. Urine cultures show multiple bacterial morphotypes. Continue zosyn till we get the identification of the gram neg rods.  She is currently afebrile but has high leukocytosis,but the leukocytosis is improving.  She is more receptive today and talking.     Hypernatremia: Probably from free water deficit. Recommend free water intake orally and started her on dextrose water fluids.  Her free water deficit probably around 4 liters.  recommend checking BMP tomorrow. Her sodium has improved to 158.  Anemia:  Normocytic. Slight drop from admission. Continue to monitor.    Elevated lactic acid probably from infection and acute renal failure.  Probably from dehydration and infection, renal parameters improving.    Difficulty swallowing and pain during swallowing: ? Thrush and swallow eval ordered.  Recommended dysphagia one diet.    Failure to thrive: Patients daughter wants to talk about home hospice. Case manager requested to give the patient's daughter resources.      Code Status: DNR Family Communication: daughter at bedside Disposition Plan: pending. PT recommended SNF,but daughter wants to taker her home with possible hospice.    Consultants: none  Procedures:  none  Antibiotics:  Zosyn 2/18.   HPI/Subjective: She is minimally conversative, daughter at bedside. She denies any nausea, vomitingn or abdominal pain.    Objective: Filed Vitals:   12/22/14 1403  BP: 109/54  Pulse: 81  Temp: 97.6 F (36.4 C)  Resp:     Intake/Output Summary (Last 24 hours) at 12/22/14 1635 Last data filed at 12/22/14 1435  Gross per 24 hour  Intake   1610 ml  Output      1 ml  Net   1609 ml   There were no vitals filed for this visit.  Exam:   General:  Alert not in any distress, comfortable.   Cardiovascular: s1s2, regular rate and rhythm.   Respiratory: clear to auscultation,no wheezing heard. No rhonchi.   Abdomen: soft non tender non distended bowel sounds heard.   Musculoskeletal: no pedal edema.   Neuro: alert to person only. Able to move her extremities. No new focal deficits.   Data Reviewed: Basic Metabolic Panel:  Recent Labs Lab 12/20/14 1217 12/21/14 0841 12/21/14 2036 12/22/14 0932  NA 159* 162* 158* 158*  K 5.1 4.5 4.0 4.4  CL 123* >130* >130* 125*  CO2 25 21 19 20   GLUCOSE 108* 83 114* 83  BUN 114* 108* 102* 93*  CREATININE 2.45* 1.95* 1.62* 1.46*  CALCIUM 10.6* 9.6 9.7 9.8   Liver Function Tests:  Recent Labs Lab 12/20/14 1217  AST 27  ALT 27  ALKPHOS 80  BILITOT 0.6  PROT 7.9  ALBUMIN 2.6*   No results for input(s): LIPASE, AMYLASE in the last 168 hours. No results for input(s): AMMONIA in the last 168 hours. CBC:  Recent Labs Lab 12/20/14 1217 12/21/14 0841 12/22/14 0932  WBC 22.2* 22.9* 16.9*  NEUTROABS 19.6*  --   --   HGB 9.5* 8.6* 9.5*  HCT 31.3* 28.9* 30.5*  MCV 88.4 91.2 89.2  PLT  378 370 332   Cardiac Enzymes: No results for input(s): CKTOTAL, CKMB, CKMBINDEX, TROPONINI in the last 168 hours. BNP (last 3 results) No results for input(s): BNP in the last 8760 hours.  ProBNP (last 3 results) No results for input(s): PROBNP in the last 8760 hours.  CBG: No results for input(s): GLUCAP in the last 168 hours.  Recent Results (from the past 240 hour(s))  Urine culture     Status: None   Collection Time: 12/20/14 11:27 AM  Result Value  Ref Range Status   Specimen Description URINE, CLEAN CATCH  Final   Special Requests NONE  Final   Colony Count   Final    >=100,000 COLONIES/ML Performed at [facility]    Culture   Final    Multiple bacterial morphotypes present, none predominant. Suggest appropriate recollection if clinically indicated. Performed at [facility]    Report Status 12/22/2014 FINAL  Final  Culture, blood (routine x 2)     Status: None (Preliminary result)   Collection Time: 12/20/14  4:48 PM  Result Value Ref Range Status   Specimen Description BLOOD RIGHT ARM  Final   Special Requests BOTTLES DRAWN AEROBIC AND ANAEROBIC 5CC EACH  Final   Culture   Final    GRAM NEGATIVE RODS Note: Gram Stain Report Called to,Read Back By and Verified With: SCOTT G RN 12/21/14 AT 1300 BY [facility] Performed at [facility]    Report Status PENDING  Incomplete  Culture, blood (routine x 2)     Status: None (Preliminary result)   Collection Time: 12/20/14  6:51 PM  Result Value Ref Range Status   Specimen Description BLOOD RIGHT FOREARM  Final   Special Requests BOTTLES DRAWN AEROBIC ONLY 2CC  Final   Culture   Final           BLOOD CULTURE RECEIVED NO GROWTH TO DATE CULTURE WILL BE HELD FOR 5 DAYS BEFORE ISSUING A FINAL NEGATIVE REPORT Note: Culture results may be compromised due to an inadequate volume of blood received in culture bottles. Performed at [facility]    Report Status PENDING  Incomplete     Studies: No results found.  Scheduled Meds: . docusate sodium  100 mg Oral BID  . feeding supplement (ENSURE COMPLETE)  237 mL Oral TID WC  . fluconazole  200 mg Oral Daily  . heparin  5,000 Units Subcutaneous 3 times per day  . magnesium hydroxide  30 mL Oral Daily  . nystatin  5 mL Oral QID  . piperacillin-tazobactam (ZOSYN)  IV  3.375 g Intravenous Q8H  . polyethylene glycol  17 g Oral BID  . senna-docusate  2 tablet Oral BID  . sodium chloride  3 mL Intravenous Q12H    Continuous Infusions: . dextrose 100 mL/hr at 12/22/14 8270    Principal Problem:   Sepsis Active Problems:   Essential hypertension   Leukocytosis   UTI (lower urinary tract infection)   AKI (acute kidney injury)   FTT (failure to thrive) in adult    Time spent: 25 min    [facility] [place] [facility] Pager [phone]. If 7PM-7AM, please contact night-coverage at [email or web address], password [email or web address] 12/22/2014, 4:35 PM  LOS: 2 days

## 2014-12-22 NOTE — Progress Notes (Signed)
Pt sodium climbed to 174. She is presently on D5W. Per RN, pt is not eating or drinking well. Will place NGT and given free water boluses every 6 hours. Continue to check BMPs every 12 hours.  Clance Boll, NP [facility]

## 2014-12-22 NOTE — Clinical Documentation Improvement (Signed)
  Per 2/18 eval by Registered Dietician "meets criteria for Severe protein calorie malnutrition in the context of chronic illness as evidenced by severe fat and muscle depletion, <75% estimated energy intake x 1 month, 26% wt loss x 1 year." .  Current BMI 14.53, 64% of Ideal Body Wt . Subcutaneous Fat:  Orbital Region: severe depletion Upper Arm Region: severe depletion Thoracic and Lumbar Region: severe depletion . Muscle:  Temple Region: severe depletion Clavicle Bone Region: severe depletion Clavicle and Acromion Bone Region: severe depletion Scapular Bone Region: severe depletion Dorsal Hand: severe depletion Patellar Region: severe depletion Anterior Thigh Region: severe depletion Posterior Calf Region: severe depletion  Thank You,  Barrie Dunker RN BSN [place] [phone] HIM Department

## 2014-12-22 NOTE — Progress Notes (Signed)
Pt's family member (daughter: Golden Circle) states that her mother had spoken to her and wanted to be resuscitated in case of any type of emergency. Golden Circle states that her mother (the pt) wants everything done for her. Karleen Hampshire MD paged and telephone order for FULL CODE status changed in system. Will continue to monitor

## 2014-12-22 NOTE — Progress Notes (Signed)
Speech Language Pathology Treatment: Dysphagia  Patient Details Name: Jacqueline Huber MRN: [id number] DOB: 05-30-1921 Today's Date: 12/22/2014 Time: 7903-8333 SLP Time Calculation (min) (ACUTE ONLY): 30 min  Assessment / Plan / Recommendation Clinical Impression  Pt sitting upright in bed with daughter present.  Educated daughter Jacqueline Huber and pt to dysphagia, mitigation strategies, compensation strategies with progressive cognitive decline.  Daughter reports pt swallowed well prior to a few weeks ago but intake was poor.  She further reports pt has always required medicine crushed likely due to her h/o throat cancer.    Observed pt self feeding apple juice via straw - multiple swallows noted  (? piecemealing or pharyngeal stasis) but no overt indications of aspiration.  Daughter reported pt has always conducted multiple swallows.  Suspect component of minimal chronic dysphagia that pt is managing well until this acute illness.   Recommend continue diet with full assistance.     HPI HPI: Jacqueline Huber is a 81 y.o. female with a past medical history of cognitive impairment, hypertension, history of hip fracture, throat cancer in the 60's - surgery, ? radiation tx presenting to the emergency department having a functional decline over the past 2 weeks with progressive weakness   Pertinent Vitals Pain Assessment: No/denies pain  SLP Plan  Continue with current plan of care    Recommendations Diet recommendations: Thin liquid;Dysphagia 1 (puree) Liquids provided via: Cup;Straw Medication Administration: Crushed with puree Supervision: Patient able to self feed;Full supervision/cueing for compensatory strategies Compensations: Slow rate;Small sips/bites;Check for pocketing (start meal with liquids) Postural Changes and/or Swallow Maneuvers: Seated upright 90 degrees;Upright 30-60 min after meal              Oral Care Recommendations: Oral care BID Plan: Continue with current plan of  care    [place], [place] [facility] SLP [phone]

## 2014-12-22 NOTE — Progress Notes (Addendum)
Received request to speak with pt and daughter [name] regarding general hospice education/options. Pt seen at bedside alert, oriented questions answered with soft voice-one/two words only- pt denied discomfort stated she felt better today but was tired.  -Daughter [name] shared that patient has significantly declined in the last two weeks; mostly staying in bed, eating and drinking very little. [name] stated her mother has lived with her since July and has been able to perform all ADLs able to handle the steps up to the bedrooms without a problem until 2-3 weeks ago. Daughter stated she is trying to gather as much information as possible to make a decision about care needs when pt is ready to leave the hospital.  -Daughter stated she was told by the doctor her mother probably would remain in the hospital until Monday. -Daughter voiced she is unsure about the recommendations/plan for further antibiotics and understands they are waiting on test results.  -Daughter wants to see if pt is up to participating in short term rehab to build-up her strength and possibly help with her ability to function at home.  Probation officer provided general information on hospice philosophy, services at home (team approach to care, Aide, RN, SW,Chaplain, MD- supporting the care that the family provides in the home with the goal being comfort, symptom management and not having to rush back to the hospital); discussed Hospice Medicare Benefit coverage; discussed palliative services at home, and at a facility, with transition to hospice services when the time is right. [name] shared her question of whether her mother was depressed - noting about her mother's lack of energy, withdrawal from interactions, poor PO intake and minimal verbal responses with family/staff. We also discussed while this could be depression - it could also be her body's response to infection, and changes internally, and that this may change with current  treatment. [name] acknowledged this and stated she is waiting to discuss this again with physician.  [name] stated she is still trying to sort out where things are and where to go from here. She was tearful stating it was hard to acknowledge her mother was declining as this has all happened in the last 2 weeks. We talked about quality of life and comfort approach to care.  She shared they have a small family; she and her brother each have one child and there is a great-grandchild; her father passed 3 years ago and until this past May her mother was very independent.  Emotional support offered.  Pamphlet and brochures on [facility] services at home, and Palliative services in a SNF were given to [facility] for reference. [name] is aware that Palliative Services at SNF requires an MD order from the facility physician to start services and the discharge summary from the hospital will indicate this was recommended by the hospital doctor. Should [name] decide on hospice services at home she is aware the CM will notify hospice agency of her choice and that representative will follow-up. She is aware that [facility] and/or SW will follow-up with her to assist with decision making and that this writer is available    *Of note during discussion [name] shared she and her mother did discuss code status and her mother indicated she would want to be resussitated; I discussed that while a DNR code status was not required to be a hospice pt this may be something that they discuss further with the doctor given pt's frail state, this could cause more discomfort and bodily damage; [name] voiced understanding however Ms  Rickman did not make any comments.   [facility] contact number [phone] given to [name] should she have further questions. Above information discussed with [facility] Neoma Laming. Writer available to follow-up, if needed, on Monday. Danton Sewer, RN MSN [facility] Liaison [phone]

## 2014-12-23 LAB — CULTURE, BLOOD (ROUTINE X 2)

## 2014-12-23 LAB — BASIC METABOLIC PANEL
ANION GAP: 5 (ref 5–15)
Anion gap: 10 (ref 5–15)
Anion gap: 11 (ref 5–15)
BUN: 77 mg/dL — AB (ref 6–23)
BUN: 73 mg/dL — AB (ref 6–23)
BUN: 63 mg/dL — ABNORMAL HIGH (ref 6–23)
CALCIUM: 9.6 mg/dL (ref 8.4–10.5)
CHLORIDE: 110 mmol/L (ref 96–112)
CO2: 29 mmol/L (ref 19–32)
CO2: 23 mmol/L (ref 19–32)
CO2: 23 mmol/L (ref 19–32)
CREATININE: 1.24 mg/dL — AB (ref 0.50–1.10)
Calcium: 9.3 mg/dL (ref 8.4–10.5)
Calcium: 9.7 mg/dL (ref 8.4–10.5)
Chloride: 117 mmol/L — ABNORMAL HIGH (ref 96–112)
Chloride: 116 mmol/L — ABNORMAL HIGH (ref 96–112)
Creatinine, Ser: 1.27 mg/dL — ABNORMAL HIGH (ref 0.50–1.10)
Creatinine, Ser: 1.16 mg/dL — ABNORMAL HIGH (ref 0.50–1.10)
GFR calc Af Amer: 43 mL/min — ABNORMAL LOW (ref >90)
GFR calc Af Amer: 41 mL/min — ABNORMAL LOW (ref >90)
GFR calc non Af Amer: 36 mL/min — ABNORMAL LOW (ref >90)
GFR calc non Af Amer: 40 mL/min — ABNORMAL LOW (ref >90)
GFR, EST AFRICAN AMERICAN: 46 mL/min — AB (ref >90)
GFR, EST NON AFRICAN AMERICAN: 37 mL/min — AB (ref >90)
GLUCOSE: 107 mg/dL — AB (ref 70–99)
GLUCOSE: 79 mg/dL (ref 70–99)
Glucose, Bld: 81 mg/dL (ref 70–99)
POTASSIUM: 4.6 mmol/L (ref 3.5–5.1)
POTASSIUM: 3.4 mmol/L — AB (ref 3.5–5.1)
Potassium: 3.7 mmol/L (ref 3.5–5.1)
Sodium: 151 mmol/L — ABNORMAL HIGH (ref 135–145)
Sodium: 149 mmol/L — ABNORMAL HIGH (ref 135–145)
Sodium: 144 mmol/L (ref 135–145)

## 2014-12-23 LAB — PROCALCITONIN: PROCALCITONIN: 0.75 ng/mL

## 2014-12-23 MED ORDER — CIPROFLOXACIN HCL 250 MG PO TABS
250.0000 mg | ORAL_TABLET | Freq: Two times a day (BID) | ORAL | Status: DC
  Administered 2014-12-23 – 2014-12-25 (×5): 250 mg via ORAL
  Filled 2014-12-23 (×6): qty 1

## 2014-12-23 MED ORDER — SODIUM CHLORIDE 0.9 % IV SOLN
1.0000 g | Freq: Once | INTRAVENOUS | Status: AC
  Administered 2014-12-23: 1 g via INTRAVENOUS
  Filled 2014-12-23: qty 10

## 2014-12-23 NOTE — Progress Notes (Signed)
NG tube was placed. X-ray showed that NG Tube is in the stomach but needed to be advanced a little. NG tube was advanced at this time, and is now good to use.

## 2014-12-23 NOTE — Progress Notes (Addendum)
[facility] PROGRESS NOTE  Jacqueline Huber XKG:[id number] DOB: 04/19/1921 DOA: 12/20/2014 PCP: [name], MD Interim summary: 81 y.o. Female with prior h/o dementia, constipation, hypertension,  recently discharged from [facility] comes in today for generalized weakness, functional decline and she was found to be in acute renal failure, hypernatremia, urinary tract infection.  Assessment/Plan: 1. Leukocytosis and urinary tract infection: Initially started on rocephin and later on changed to zosyn after blood cultures came positive for gram neg rods. Urine cultures show multiple bacterial morphotypes. Klebsiella grew in one blood cultures,  Sensitive to ciprofloxacin. She is currently afebrile but has high leukocytosis,but the leukocytosis is improving.  She is more receptive today and talking.     Hypernatremia: Probably from free water deficit. Recommend free water intake orally and started her on dextrose water fluids.  Her free water deficit probably around 4 liters. NG tube put in last night and free water boluses ordered.  recommend checking BMP tomorrow. Her sodium has improved to 149.  Anemia:  Normocytic. Slight drop from admission. Continue to monitor.    Elevated lactic acid probably from infection and acute renal failure.  Probably from dehydration and infection, renal parameters improving. She is at her baseline of 1.27.   Difficulty swallowing and pain during swallowing: ? Thrush and swallow eval ordered.  Recommended dysphagia one diet.    Failure to thrive: Patients daughter wants to talk about home hospice, after she is discharged to SNF. Marland Kitchen  Case manager requested to give the patient's daughter resources.      Code Status: full code.  Family Communication: daughter at bedside Disposition Plan: pending. PT recommended SNF, palliative care services.    Consultants: Palliative care consulte.   Procedures:  none  Antibiotics:  Zosyn 2/18. -  2/20  HPI/Subjective: daughter at bedside. She denies any nausea, vomitingn or abdominal pain.  She is talking more today.  Objective: Filed Vitals:   12/23/14 1355  BP: 110/64  Pulse: 72  Temp: 98.3 F (36.8 C)  Resp: 16    Intake/Output Summary (Last 24 hours) at 12/23/14 1619 Last data filed at 12/23/14 1200  Gross per 24 hour  Intake 2254.33 ml  Output      0 ml  Net 2254.33 ml   There were no vitals filed for this visit.  Exam:   General:  Alert not in any distress, comfortable. Thin frail looking lady, cachetic.   Cardiovascular: s1s2, regular rate and rhythm.   Respiratory: clear to auscultation,no wheezing heard. No rhonchi.   Abdomen: soft non tender non distended bowel sounds heard.   Musculoskeletal: no pedal edema.   Neuro: alert to person only. Able to move her extremities. No new focal deficits.   Data Reviewed: Basic Metabolic Panel:  Recent Labs Lab 12/21/14 2036 12/22/14 0932 12/22/14 1948 12/23/14 0635 12/23/14 1130  NA 158* 158* 174* 151* 149*  K 4.0 4.4 3.5 3.7 4.6  CL >130* 125* 101 117* 116*  CO2 19 20 19 29 23   GLUCOSE 114* 83 94 107* 79  BUN 102* 93* 75* 77* 73*  CREATININE 1.62* 1.46* 1.30* 1.24* 1.27*  CALCIUM 9.7 9.8 6.1* 9.6 9.3   Liver Function Tests:  Recent Labs Lab 12/20/14 1217  AST 27  ALT 27  ALKPHOS 80  BILITOT 0.6  PROT 7.9  ALBUMIN 2.6*   No results for input(s): LIPASE, AMYLASE in the last 168 hours. No results for input(s): AMMONIA in the last 168 hours. CBC:  Recent Labs Lab 12/20/14 1217 12/21/14  5427 12/22/14 0932  WBC 22.2* 22.9* 16.9*  NEUTROABS 19.6*  --   --   HGB 9.5* 8.6* 9.5*  HCT 31.3* 28.9* 30.5*  MCV 88.4 91.2 89.2  PLT 378 370 332   Cardiac Enzymes: No results for input(s): CKTOTAL, CKMB, CKMBINDEX, TROPONINI in the last 168 hours. BNP (last 3 results) No results for input(s): BNP in the last 8760 hours.  ProBNP (last 3 results) No results for input(s): PROBNP in the last  8760 hours.  CBG: No results for input(s): GLUCAP in the last 168 hours.  Recent Results (from the past 240 hour(s))  Urine culture     Status: None   Collection Time: 12/20/14 11:27 AM  Result Value Ref Range Status   Specimen Description URINE, CLEAN CATCH  Final   Special Requests NONE  Final   Colony Count   Final    >=100,000 COLONIES/ML Performed at [facility]    Culture   Final    Multiple bacterial morphotypes present, none predominant. Suggest appropriate recollection if clinically indicated. Performed at [facility]    Report Status 12/22/2014 FINAL  Final  Culture, blood (routine x 2)     Status: None   Collection Time: 12/20/14  4:48 PM  Result Value Ref Range Status   Specimen Description BLOOD RIGHT ARM  Final   Special Requests BOTTLES DRAWN AEROBIC AND ANAEROBIC 5CC EACH  Final   Culture   Final    KLEBSIELLA PNEUMONIAE Note: Gram Stain Report Called to,Read Back By and Verified With: SCOTT G RN 12/21/14 AT 69 BY [facility] Performed at [facility]    Report Status 12/23/2014 FINAL  Final   Organism ID, Bacteria KLEBSIELLA PNEUMONIAE  Final      Susceptibility   Klebsiella pneumoniae - MIC*    AMPICILLIN >=32 RESISTANT Resistant     AMPICILLIN/SULBACTAM 4 SENSITIVE Sensitive     CEFAZOLIN <=4 SENSITIVE Sensitive     CEFEPIME <=1 SENSITIVE Sensitive     CEFTAZIDIME <=1 SENSITIVE Sensitive     CEFTRIAXONE <=1 SENSITIVE Sensitive     CIPROFLOXACIN <=0.25 SENSITIVE Sensitive     GENTAMICIN <=1 SENSITIVE Sensitive     IMIPENEM <=0.25 SENSITIVE Sensitive     PIP/TAZO <=4 SENSITIVE Sensitive     TOBRAMYCIN <=1 SENSITIVE Sensitive     TRIMETH/SULFA <=20 SENSITIVE Sensitive     * KLEBSIELLA PNEUMONIAE  Culture, blood (routine x 2)     Status: None (Preliminary result)   Collection Time: 12/20/14  6:51 PM  Result Value Ref Range Status   Specimen Description BLOOD RIGHT FOREARM  Final   Special Requests BOTTLES DRAWN AEROBIC ONLY 2CC   Final   Culture   Final           BLOOD CULTURE RECEIVED NO GROWTH TO DATE CULTURE WILL BE HELD FOR 5 DAYS BEFORE ISSUING A FINAL NEGATIVE REPORT Note: Culture results may be compromised due to an inadequate volume of blood received in culture bottles. Performed at [facility]    Report Status PENDING  Incomplete     Studies: Dg Abd Portable 1v  12/22/2014   CLINICAL DATA:  Hyponatremia.  Evaluate nasogastric tube.  EXAM: PORTABLE ABDOMEN - 1 VIEW  COMPARISON:  12/02/2014  FINDINGS: The nasogastric tube tip reaches the stomach but the side port is above the EG junction. This should be advanced for optimal placement.  IMPRESSION: Nasogastric tube should be advanced for optimal placement. The side port is above the EG junction.  Electronically Signed   By: Andreas Newport M.D.   On: 12/22/2014 23:54    Scheduled Meds: . calcium gluconate  1 g Intravenous Once  . docusate sodium  100 mg Oral BID  . feeding supplement (ENSURE COMPLETE)  237 mL Oral TID WC  . fluconazole  200 mg Oral Daily  . heparin  5,000 Units Subcutaneous 3 times per day  . magnesium hydroxide  30 mL Oral Daily  . nystatin  5 mL Oral QID  . polyethylene glycol  17 g Oral BID  . senna-docusate  2 tablet Oral BID  . sodium chloride  3 mL Intravenous Q12H   Continuous Infusions: . dextrose 100 mL/hr at 12/23/14 5852    Principal Problem:   Sepsis Active Problems:   Essential hypertension   Leukocytosis   UTI (lower urinary tract infection)   AKI (acute kidney injury)   FTT (failure to thrive) in adult    Time spent: 25 min    [place] [facility] Pager [phone]. If 7PM-7AM, please contact night-coverage at [email or web address], password [facility] 12/23/2014, 4:19 PM  LOS: 3 days

## 2014-12-24 DIAGNOSIS — Z515 Encounter for palliative care: Secondary | ICD-10-CM | POA: Insufficient documentation

## 2014-12-24 DIAGNOSIS — K5909 Other constipation: Secondary | ICD-10-CM | POA: Insufficient documentation

## 2014-12-24 DIAGNOSIS — R630 Anorexia: Secondary | ICD-10-CM | POA: Insufficient documentation

## 2014-12-24 DIAGNOSIS — K5900 Constipation, unspecified: Secondary | ICD-10-CM

## 2014-12-24 LAB — CBC
HCT: 24.8 % — ABNORMAL LOW (ref 36.0–46.0)
Hemoglobin: 7.9 g/dL — ABNORMAL LOW (ref 12.0–15.0)
MCH: 27.3 pg (ref 26.0–34.0)
MCHC: 31.9 g/dL (ref 30.0–36.0)
MCV: 85.8 fL (ref 78.0–100.0)
Platelets: 366 10*3/uL (ref 150–400)
RBC: 2.89 MIL/uL — AB (ref 3.87–5.11)
RDW: 14.5 % (ref 11.5–15.5)
WBC: 10.4 10*3/uL (ref 4.0–10.5)

## 2014-12-24 LAB — BASIC METABOLIC PANEL
Anion gap: 9 (ref 5–15)
BUN: 57 mg/dL — ABNORMAL HIGH (ref 6–23)
CALCIUM: 9.8 mg/dL (ref 8.4–10.5)
CO2: 24 mmol/L (ref 19–32)
CREATININE: 1.01 mg/dL (ref 0.50–1.10)
Chloride: 110 mmol/L (ref 96–112)
GFR calc Af Amer: 55 mL/min — ABNORMAL LOW (ref >90)
GFR, EST NON AFRICAN AMERICAN: 47 mL/min — AB (ref >90)
Glucose, Bld: 83 mg/dL (ref 70–99)
Potassium: 3.7 mmol/L (ref 3.5–5.1)
SODIUM: 143 mmol/L (ref 135–145)

## 2014-12-24 MED ORDER — DOCUSATE SODIUM 50 MG/5ML PO LIQD
100.0000 mg | Freq: Two times a day (BID) | ORAL | Status: DC
  Administered 2014-12-24 – 2014-12-25 (×2): 100 mg
  Filled 2014-12-24 (×4): qty 10

## 2014-12-24 MED ORDER — FLUCONAZOLE 200 MG PO TABS
200.0000 mg | ORAL_TABLET | Freq: Once | ORAL | Status: AC
  Administered 2014-12-25: 200 mg via ORAL
  Filled 2014-12-24: qty 1

## 2014-12-24 MED ORDER — SENNOSIDES 8.8 MG/5ML PO SYRP
10.0000 mL | ORAL_SOLUTION | Freq: Two times a day (BID) | ORAL | Status: DC
  Administered 2014-12-24 – 2014-12-25 (×2): 10 mL via ORAL
  Filled 2014-12-24 (×4): qty 10

## 2014-12-24 MED ORDER — MIRTAZAPINE 15 MG PO TBDP
15.0000 mg | ORAL_TABLET | Freq: Every day | ORAL | Status: DC
Start: 2014-12-24 — End: 2014-12-25
  Administered 2014-12-24: 15 mg via ORAL
  Filled 2014-12-24 (×2): qty 1

## 2014-12-24 NOTE — Progress Notes (Signed)
RN called to ask for a notary...she had a family who wanted to notarize advance directives.  When I talked with the family, they really wanted to wait until tomorrow when other family would be present.  I assured the family that we would assist on Monday, that they should ask the RN to page the chaplain when the family had arrived.  They felt comfortable with this.  I notified the RN of our discussion.and will notify chaplain of pending need.  Please page [phone] when family is ready to complete notarizing Advance Directives.  Rev. Levasy, [place]

## 2014-12-24 NOTE — Consult Note (Addendum)
Patient OQ:[name]      DOB: 31-Jul-1921      YTK:[id number]     Consult Note from the Palliative Medicine Team at [facility] Requested by: Dr Karleen Hampshire     PCP: [name], MD Reason for Consultation:goals of care     Phone Number:[phone]  Assessment/Recommendations: 81 yo female with dementia, chronic constipation, FTT who presented with functional decline, dehydration, decreased PO intake.   1.  Code Status: Full, I did not address today. As noted by hospice liaison, family wished her to be full code.   2. GOC: Reviewed notes from Dr Karleen Hampshire as well as [facility] liaison.  Certainly Jacqueline Huber has had significant decline over past several months and really year, but her decline seems to be accelerating more recently. Family acknowledges this today. i sensed some distrust of my role and motives for talking to them today.  Perhaps it was also fatigue about talking through Jacqueline Huber's situation. Ultimnately at this point, they plan on taking her to SNF to see how much she can rehab. I think hospice care has been discussed and Ultimately what they would like to do is involve palliative care at [facility] and then pursue home hospice services.  They reinforce with me that home hospice will be to get the services and that they are not in comfort care mode.  Rather than try to reinforce decline and poor prognosis, I chose to reinforce what we hope to achieve at rehab over next few weeks.  I told them that these next few weeks will be important for Jacqueline Huber as if she is not making progress this will be poor prognostic sign for her.  With albumin of 2.6, weight loss of >30lbs in past year, I suspect she has very little chance of significant functional improvement in rehab, though family states this went well last time she was there.  I think for their own piece of mind, they need to see how this all goes first.    3. Symptom Management:   1. Loss of Appetite/FTT: Will try mirtazapine. Doubt will lead  to significant improvement and I tempered expectations of this with family  2. Constipation: chronic issue. Going to receive meds through NG today. May need suppository or enema.  3. ?Depression- family concern given failure to thrive.  Mirtazapine as above will also help if this is playing role in her FTT.  Again, not suspecting this is likely to reverse her rapidly declining trajectory.   4. Psychosocial/Spiritual:  1 living child (daughter Jacqueline Huber) whom she lives with.  Formerly worked as a Automotive engineer   Brief HPI: 81 yo female with PMHx of dementia/cognitive impairment, HTN, hip fx who was admitted on 2/17 with declining functional status, decreased PO intake constipation worsening over past 2 weeks.  She lives at home with her daughter who states she has been basically bed bound for past 2 weeks.  Had previous hip fx, and with rehab had been to stage of using rolling walker.  Workup here revealed AKI, dehydration, UTI, klebsiella bacteremia, hypernatremia.  Initially DNR on admission, and family pursuing hospice care. After meeting with hospice liaison, decision made to pursue SNF/rehab and family plan is to take Jacqueline Huber home with hospice care after this.      PMH:  Past Medical History  Diagnosis Date  . Hypertension   . Constipation   . Throat cancer 1960s  . Arthritis     "knees" (12/20/2014)  . Depression  PSH: Past Surgical History  Procedure Laterality Date  . Throat surgery  1960's    "for cancer"  . Intramedullary (im) nail intertrochanteric Left 03/03/2014    Procedure: INTRAMEDULLARY (IM) NAIL INTERTROCHANTRIC;  Surgeon: [name], MD;  Location: [facility];  Service: Orthopedics;  Laterality: Left;  . Tonsillectomy  1960's  . Cataract extraction w/ intraocular lens  implant, bilateral Bilateral   . Fracture surgery     I have reviewed the FH and SH and  If appropriate update it with new information. No Known Allergies Scheduled Meds: . ciprofloxacin  250 mg Oral  BID  . sennosides  10 mL Oral BID   And  . docusate  100 mg Per Tube BID  . feeding supplement (ENSURE COMPLETE)  237 mL Oral TID WC  . fluconazole  200 mg Oral Daily  . heparin  5,000 Units Subcutaneous 3 times per day  . magnesium hydroxide  30 mL Oral Daily  . mirtazapine  15 mg Oral QHS  . nystatin  5 mL Oral QID  . polyethylene glycol  17 g Oral BID  . sodium chloride  3 mL Intravenous Q12H   Continuous Infusions: . dextrose 800 mL (12/24/14 1105)   PRN Meds:.acetaminophen **OR** acetaminophen, bisacodyl, ondansetron **OR** ondansetron (ZOFRAN) IV    BP 113/55 mmHg  Pulse 80  Temp(Src) 98 F (36.7 C) (Oral)  Resp 16  SpO2 100%   PPS: 30   Intake/Output Summary (Last 24 hours) at 12/24/14 1436 Last data filed at 12/24/14 1002  Gross per 24 hour  Intake 2741.66 ml  Output      0 ml  Net 2741.66 ml    Physical Exam:  General: Alert, NAD, cachectic HEENT:  +NG, mmm, sclera anicteric Chest:   CTAB CVS: RRR Abdomen: soft, ND Ext: warm, no edema Skin: warm/dry  Labs: CBC    Component Value Date/Time   WBC 10.4 12/24/2014 0606   RBC 2.89* 12/24/2014 0606   RBC 2.80* 03/03/2014 0402   HGB 7.9* 12/24/2014 0606   HCT 24.8* 12/24/2014 0606   PLT 366 12/24/2014 0606   MCV 85.8 12/24/2014 0606   MCH 27.3 12/24/2014 0606   MCHC 31.9 12/24/2014 0606   RDW 14.5 12/24/2014 0606   LYMPHSABS 1.1 12/20/2014 1217   MONOABS 1.4* 12/20/2014 1217   EOSABS 0.0 12/20/2014 1217   BASOSABS 0.0 12/20/2014 1217    BMET    Component Value Date/Time   NA 143 12/24/2014 0720   K 3.7 12/24/2014 0720   CL 110 12/24/2014 0720   CO2 24 12/24/2014 0720   GLUCOSE 83 12/24/2014 0720   BUN 57* 12/24/2014 0720   CREATININE 1.01 12/24/2014 0720   CALCIUM 9.8 12/24/2014 0720   GFRNONAA 47* 12/24/2014 0720   GFRAA 55* 12/24/2014 0720    CMP     Component Value Date/Time   NA 143 12/24/2014 0720   K 3.7 12/24/2014 0720   CL 110 12/24/2014 0720   CO2 24 12/24/2014 0720    GLUCOSE 83 12/24/2014 0720   BUN 57* 12/24/2014 0720   CREATININE 1.01 12/24/2014 0720   CALCIUM 9.8 12/24/2014 0720   PROT 7.9 12/20/2014 1217   ALBUMIN 2.6* 12/20/2014 1217   AST 27 12/20/2014 1217   ALT 27 12/20/2014 1217   ALKPHOS 80 12/20/2014 1217   BILITOT 0.6 12/20/2014 1217   GFRNONAA 47* 12/24/2014 0720   GFRAA 55* 12/24/2014 0720   2/17 CXR IMPRESSION: No edema or pneumonia.  2/17 CT Head  IMPRESSION: 1. No evidence of acute intracranial disease. 2. Senescent and remote ischemic changes are noted above.    Total Time: 60 minutes Greater than 50%  of this time was spent counseling and coordinating care related to the above assessment and plan.   Doran Clay D.O. Palliative Medicine Team at [facility]  Pager: [phone] Team Phone: [phone]

## 2014-12-24 NOTE — Progress Notes (Signed)
[facility] PROGRESS NOTE  Jacqueline Huber EUM:[id number] DOB: 09-Oct-1921 DOA: 12/20/2014 PCP: [name], MD Interim summary: 81 y.o. Female with prior h/o dementia, constipation, hypertension,  recently discharged from [facility] comes in today for generalized weakness, functional decline and she was found to be in acute renal failure, hypernatremia, urinary tract infection.  Assessment/Plan: 1. Leukocytosis and urinary tract infection: Initially started on rocephin and later on changed to zosyn after blood cultures came positive for gram neg rods. Urine cultures show multiple bacterial morphotypes. Klebsiella grew in one blood cultures,  Sensitive to ciprofloxacin. Stopped zosyn and changed to ciprofloxacin.  She is currently afebrile but has high leukocytosis,but the leukocytosis is improving.     Hypernatremia: Probably from free water deficit. Recommend free water intake orally and started her on dextrose water fluids. . NG tube put in and free water boluses ordered. Her sodium has much improved. And her dextrose fluids will be stopped and we will recheck her BMP in am.  But since she is not eating , she might become hypernatremic again, discussed with the daughter and called palliative care consult to talk about goals of care, feeding tube and nutrition.   Anemia:  Normocytic.  droped from admission, i assume the sample on admission is probably hemoconcentrated sample. Recommend watching the hemoglobin and monitor. Transfuse if needed.    Elevated lactic acid probably from infection and acute renal failure.  Probably from dehydration and infection, renal parameters improving.    Difficulty swallowing and pain during swallowing: ? Thrush and swallow eval ordered.  Recommended dysphagia one diet.  Plan to stop the diflucan after tomorrow's dose.    Failure to thrive: Patients daughter wants to talk about home hospice, after she is discharged to SNF.  Also requested palliative  care consult for goals of care.  Case manager requested to give the patient's daughter resources.      Code Status: full code.  Family Communication: none  at bedside Disposition Plan: pending. PT recommended SNF, . Family still thinking about disposition home vs SNF.    Consultants: Palliative care consult.   Procedures:  none  Antibiotics:  Zosyn 2/18. - 2/20  Ciprofloxacin.   HPI/Subjective: She is the same as yesterday with a NG tube. Comfortable, not complaining.  Objective: Filed Vitals:   12/24/14 1427  BP: 113/55  Pulse: 80  Temp: 98 F (36.7 C)  Resp: 16    Intake/Output Summary (Last 24 hours) at 12/24/14 1904 Last data filed at 12/24/14 1836  Gross per 24 hour  Intake 2412.08 ml  Output      0 ml  Net 2412.08 ml   There were no vitals filed for this visit.  Exam:   General:  Alert not in any distress, comfortable. Thin frail looking lady, cachetic.   Cardiovascular: s1s2, regular rate and rhythm. No murmers.   Respiratory: clear to auscultation,no wheezing heard. No rhonchi.   Abdomen: soft non tender non distended bowel sounds heard.   Musculoskeletal: no pedal edema.   Neuro: alert to person only. Able to move her extremities. No new focal deficits.   Data Reviewed: Basic Metabolic Panel:  Recent Labs Lab 12/22/14 1948 12/23/14 0635 12/23/14 1130 12/23/14 2234 12/24/14 0720  NA 174* 151* 149* 144 143  K 3.5 3.7 4.6 3.4* 3.7  CL 101 117* 116* 110 110  CO2 19 29 23 23 24   GLUCOSE 94 107* 79 81 83  BUN 75* 77* 73* 63* 57*  CREATININE 1.30* 1.24* 1.27* 1.16* 1.01  CALCIUM 6.1* 9.6 9.3 9.7 9.8   Liver Function Tests:  Recent Labs Lab 12/20/14 1217  AST 27  ALT 27  ALKPHOS 80  BILITOT 0.6  PROT 7.9  ALBUMIN 2.6*   No results for input(s): LIPASE, AMYLASE in the last 168 hours. No results for input(s): AMMONIA in the last 168 hours. CBC:  Recent Labs Lab 12/20/14 1217 12/21/14 0841 12/22/14 0932 12/24/14 0606   WBC 22.2* 22.9* 16.9* 10.4  NEUTROABS 19.6*  --   --   --   HGB 9.5* 8.6* 9.5* 7.9*  HCT 31.3* 28.9* 30.5* 24.8*  MCV 88.4 91.2 89.2 85.8  PLT 378 370 332 366   Cardiac Enzymes: No results for input(s): CKTOTAL, CKMB, CKMBINDEX, TROPONINI in the last 168 hours. BNP (last 3 results) No results for input(s): BNP in the last 8760 hours.  ProBNP (last 3 results) No results for input(s): PROBNP in the last 8760 hours.  CBG: No results for input(s): GLUCAP in the last 168 hours.  Recent Results (from the past 240 hour(s))  Urine culture     Status: None   Collection Time: 12/20/14 11:27 AM  Result Value Ref Range Status   Specimen Description URINE, CLEAN CATCH  Final   Special Requests NONE  Final   Colony Count   Final    >=100,000 COLONIES/ML Performed at [facility]    Culture   Final    Multiple bacterial morphotypes present, none predominant. Suggest appropriate recollection if clinically indicated. Performed at [facility]    Report Status 12/22/2014 FINAL  Final  Culture, blood (routine x 2)     Status: None   Collection Time: 12/20/14  4:48 PM  Result Value Ref Range Status   Specimen Description BLOOD RIGHT ARM  Final   Special Requests BOTTLES DRAWN AEROBIC AND ANAEROBIC 5CC EACH  Final   Culture   Final    KLEBSIELLA PNEUMONIAE Note: Gram Stain Report Called to,Read Back By and Verified With: SCOTT G RN 12/21/14 AT 37 BY [facility] [place] Performed at [facility]    Report Status 12/23/2014 FINAL  Final   Organism ID, Bacteria KLEBSIELLA PNEUMONIAE  Final      Susceptibility   Klebsiella pneumoniae - MIC*    AMPICILLIN >=32 RESISTANT Resistant     AMPICILLIN/SULBACTAM 4 SENSITIVE Sensitive     CEFAZOLIN <=4 SENSITIVE Sensitive     CEFEPIME <=1 SENSITIVE Sensitive     CEFTAZIDIME <=1 SENSITIVE Sensitive     CEFTRIAXONE <=1 SENSITIVE Sensitive     CIPROFLOXACIN <=0.25 SENSITIVE Sensitive     GENTAMICIN <=1 SENSITIVE Sensitive     IMIPENEM  <=0.25 SENSITIVE Sensitive     PIP/TAZO <=4 SENSITIVE Sensitive     TOBRAMYCIN <=1 SENSITIVE Sensitive     TRIMETH/SULFA <=20 SENSITIVE Sensitive     * KLEBSIELLA PNEUMONIAE  Culture, blood (routine x 2)     Status: None (Preliminary result)   Collection Time: 12/20/14  6:51 PM  Result Value Ref Range Status   Specimen Description BLOOD RIGHT FOREARM  Final   Special Requests BOTTLES DRAWN AEROBIC ONLY 2CC  Final   Culture   Final           BLOOD CULTURE RECEIVED NO GROWTH TO DATE CULTURE WILL BE HELD FOR 5 DAYS BEFORE ISSUING A FINAL NEGATIVE REPORT Note: Culture results may be compromised due to an inadequate volume of blood received in culture bottles. Performed at [facility] PENDING  Incomplete     Studies: Dg Abd Portable 1v  12/22/2014   CLINICAL DATA:  Hyponatremia.  Evaluate nasogastric tube.  EXAM: PORTABLE ABDOMEN - 1 VIEW  COMPARISON:  12/02/2014  FINDINGS: The nasogastric tube tip reaches the stomach but the side port is above the EG junction. This should be advanced for optimal placement.  IMPRESSION: Nasogastric tube should be advanced for optimal placement. The side port is above the EG junction.   Electronically Signed   By: Andreas Newport M.D.   On: 12/22/2014 23:54    Scheduled Meds: . ciprofloxacin  250 mg Oral BID  . sennosides  10 mL Oral BID   And  . docusate  100 mg Per Tube BID  . feeding supplement (ENSURE COMPLETE)  237 mL Oral TID WC  . fluconazole  200 mg Oral Daily  . heparin  5,000 Units Subcutaneous 3 times per day  . magnesium hydroxide  30 mL Oral Daily  . mirtazapine  15 mg Oral QHS  . nystatin  5 mL Oral QID  . polyethylene glycol  17 g Oral BID  . sodium chloride  3 mL Intravenous Q12H   Continuous Infusions: . dextrose 800 mL (12/24/14 1105)    Principal Problem:   Sepsis Active Problems:   Essential hypertension   Leukocytosis   UTI (lower urinary tract infection)   AKI (acute kidney injury)   FTT  (failure to thrive) in adult    Time spent: 25 min    [facility] Pager [phone]. If 7PM-7AM, please contact night-coverage at [email or web address], password [facility] 12/24/2014, 7:04 PM  LOS: 4 days

## 2014-12-25 LAB — CBC WITH DIFFERENTIAL/PLATELET
Basophils Absolute: 0 10*3/uL (ref 0.0–0.1)
Basophils Relative: 0 % (ref 0–1)
Eosinophils Absolute: 0.1 10*3/uL (ref 0.0–0.7)
Eosinophils Relative: 2 % (ref 0–5)
HCT: 26.1 % — ABNORMAL LOW (ref 36.0–46.0)
HEMOGLOBIN: 8.2 g/dL — AB (ref 12.0–15.0)
Lymphocytes Relative: 18 % (ref 12–46)
Lymphs Abs: 1.4 10*3/uL (ref 0.7–4.0)
MCH: 27.3 pg (ref 26.0–34.0)
MCHC: 31.4 g/dL (ref 30.0–36.0)
MCV: 87 fL (ref 78.0–100.0)
MONO ABS: 0.6 10*3/uL (ref 0.1–1.0)
MONOS PCT: 8 % (ref 3–12)
NEUTROS ABS: 5.8 10*3/uL (ref 1.7–7.7)
NEUTROS PCT: 72 % (ref 43–77)
Platelets: 413 10*3/uL — ABNORMAL HIGH (ref 150–400)
RBC: 3 MIL/uL — AB (ref 3.87–5.11)
RDW: 14.5 % (ref 11.5–15.5)
WBC: 8 10*3/uL (ref 4.0–10.5)

## 2014-12-25 LAB — BASIC METABOLIC PANEL
Anion gap: 6 (ref 5–15)
BUN: 51 mg/dL — ABNORMAL HIGH (ref 6–23)
CALCIUM: 9.6 mg/dL (ref 8.4–10.5)
CO2: 26 mmol/L (ref 19–32)
Chloride: 112 mmol/L (ref 96–112)
Creatinine, Ser: 0.99 mg/dL (ref 0.50–1.10)
GFR calc non Af Amer: 48 mL/min — ABNORMAL LOW (ref >90)
GFR, EST AFRICAN AMERICAN: 56 mL/min — AB (ref >90)
Glucose, Bld: 76 mg/dL (ref 70–99)
Potassium: 3.5 mmol/L (ref 3.5–5.1)
SODIUM: 144 mmol/L (ref 135–145)

## 2014-12-25 LAB — RETICULOCYTES
RBC.: 3.01 MIL/uL — AB (ref 3.87–5.11)
RETIC CT PCT: 1.3 % (ref 0.4–3.1)
Retic Count, Absolute: 39.1 10*3/uL (ref 19.0–186.0)

## 2014-12-25 LAB — PROCALCITONIN: Procalcitonin: 0.34 ng/mL

## 2014-12-25 MED ORDER — ENSURE COMPLETE PO LIQD: 237.0000 mL | Freq: Three times a day (TID) | ORAL | Status: AC

## 2014-12-25 MED ORDER — SULFAMETHOXAZOLE-TRIMETHOPRIM 800-160 MG PO TABS: 1.0000 | ORAL_TABLET | Freq: Every day | ORAL | Status: DC

## 2014-12-25 MED ORDER — CIPROFLOXACIN HCL 500 MG PO TABS
500.0000 mg | ORAL_TABLET | Freq: Every day | ORAL | Status: DC
  Filled 2014-12-25: qty 1

## 2014-12-25 MED ORDER — CIPROFLOXACIN HCL 250 MG PO TABS
250.0000 mg | ORAL_TABLET | ORAL | Status: AC
  Administered 2014-12-25: 250 mg via ORAL
  Filled 2014-12-25: qty 1

## 2014-12-25 MED ORDER — MIRTAZAPINE 15 MG PO TBDP: 15.0000 mg | ORAL_TABLET | Freq: Every day | ORAL | Status: AC

## 2014-12-25 MED ORDER — SULFAMETHOXAZOLE-TRIMETHOPRIM 800-160 MG PO TABS: 1.0000 | ORAL_TABLET | Freq: Every day | ORAL | Status: AC

## 2014-12-25 MED ORDER — SULFAMETHOXAZOLE-TRIMETHOPRIM 800-160 MG PO TABS: 1.0000 | ORAL_TABLET | Freq: Two times a day (BID) | ORAL | Status: DC

## 2014-12-25 NOTE — Discharge Summary (Addendum)
Physician Discharge Summary  Jacqueline Huber XTG:[id number] DOB: [birth year] DOA: 12/20/2014  PCP: Jacqueline Neer, MD  Admit date: 12/20/2014 Discharge date: 12/25/2014  Time spent: 30 minutes  Recommendations for Outpatient Follow-up:  1. Follow up with PCP in one week.  2. Follow up anemia panel and check cbc IN 2 TO 3 DAYS.  3. Follow up with palliative care services as needed and recommended.  Discharge Diagnoses:  Principal Problem:   Sepsis Active Problems:   Essential hypertension   Leukocytosis   UTI (lower urinary tract infection)   AKI (acute kidney injury)   FTT (failure to thrive) in adult   Palliative care encounter   Loss of appetite   Chronic constipation severe malnutrition  Discharge Condition: IMPROVED  Diet recommendation: dysphagia 1 diet.   Filed Weights   12/25/14 1100  Weight: 37.2 kg (82 lb 0.2 oz)    History of present illness:  81 y.o. Female with prior h/o dementia, constipation, hypertension, recently discharged from [facility] comes in today for generalized weakness, functional decline and she was found to be in acute renal failure, hypernatremia, urinary tract infection and klebsiella bacteremia.   Hospital Course:  1. Sepsis/ Leukocytosis and urinary tract infection / klebsiella bacteremia.: Initially started on rocephin and later on changed to zosyn after blood cultures came positive for gram neg rods. Urine cultures show multiple bacterial morphotypes. Klebsiella grew in one blood cultures,  Stopped zosyn and changed to bactrim on discharge.  She is currently afebrile and leukocytosis has resolved.      Hypernatremia: Probably from free water deficit. Recommend free water intake orally and started her on dextrose water fluids. . NG tube put in and free water boluses ordered. Her sodium has much improved. And her dextrose fluids  stopped and repeated sodium level is normal. But since she is not eating , she might become hypernatremic again,  discussed with the daughter and called palliative care consult to talk about goals of care, feeding tube and nutrition.   Anemia:  Normocytic. droped from admission,  assume the sample on admission is probably hemoconcentrated sample. Recommend watching the hemoglobin on discharge and follow up anemia panel on discharge with PCP.    Elevated lactic acid probably from infection and acute renal failure.  Probably from dehydration and infection, renal parameters have improved.    Difficulty swallowing and pain during swallowing: ? Thrush and swallow eval ordered.  Recommended dysphagia one diet.  Completed 5 day course of diflucan.    Failure to thrive:  requested palliative care consult for goals of care. recommended to follow up with palliative care services on discharge to SNF.   Procedures:  none  Consultations:  Palliative. ID over the phone.   Discharge Exam: Filed Vitals:   12/25/14 0540  BP: 118/65  Pulse:   Temp: 97.9 F (36.6 C)  Resp: 16    General: alert afebrile comfortable Cardiovascular: s1s2 Respiratory: ctab  Discharge Instructions   Discharge Instructions    Discharge instructions    Complete by:  As directed   Follow up with PCP in one week. Follow up withpalliative care services as needed and as recommended.          Current Discharge Medication List    START taking these medications   Details  !! feeding supplement, ENSURE COMPLETE, (ENSURE COMPLETE) LIQD Take 237 mLs by mouth 3 (three) times daily with meals.    mirtazapine (REMERON SOL-TAB) 15 MG disintegrating tablet Take 1 tablet (15 mg total) by mouth  at bedtime.    sulfamethoxazole-trimethoprim (BACTRIM DS,SEPTRA DS) 800-160 MG per tablet Take 1 tablet by mouth daily. Qty: 8 tablet, Refills: 0     !! - Potential duplicate medications found. Please discuss with provider.    CONTINUE these medications which have NOT CHANGED   Details  !! feeding supplement, ENSURE COMPLETE,  (ENSURE COMPLETE) LIQD Take 237 mLs by mouth 2 (two) times daily between meals. Qty: 10 Bottle, Refills: 0    hydrocortisone (ANUSOL-HC) 2.5 % rectal cream Place rectally 2 (two) times daily. Qty: 30 g, Refills: 0    polyethylene glycol (MIRALAX / GLYCOLAX) packet Take 17 g by mouth daily. Qty: 14 each, Refills: 0    aspirin EC 325 MG tablet Take 1 tablet (325 mg total) by mouth 2 (two) times daily. Qty: 30 tablet, Refills: 0    docusate sodium (COLACE) 100 MG capsule Take 1 capsule (100 mg total) by mouth 2 (two) times daily. Qty: 10 capsule, Refills: 0    senna-docusate (SENOKOT-S) 8.6-50 MG per tablet Take 1 tablet by mouth 2 (two) times daily. Qty: 60 tablet, Refills: 0     !! - Potential duplicate medications found. Please discuss with provider.    STOP taking these medications     amLODipine (NORVASC) 10 MG tablet      ciprofloxacin (CIPRO) 100 MG tablet      metroNIDAZOLE (FLAGYL) 500 MG tablet        No Known Allergies Follow-up Information    Follow up with Huber,KIMBERLEE, MD. Schedule an appointment as soon as possible for a visit in 1 week.   Specialty:  Family Medicine   Contact information:   [address] [place] [address] [phone]        The results of significant diagnostics from this hospitalization (including imaging, microbiology, ancillary and laboratory) are listed below for reference.    Significant Diagnostic Studies: Dg Chest 1 View  12/20/2014   CLINICAL DATA:  Difficulty swallowing, shortness of breath, and weakness  EXAM: CHEST  1 VIEW  COMPARISON:  10/29/2014  FINDINGS: No cardiomegaly. Unusual appearance of the left lower mediastinum which may be from leftward rotation. Rotation also accentuates aortic tortuosity. Pulmonary hyperinflation. There is no edema, consolidation, effusion, or pneumothorax. A nipple shadow is noted at the right base.  IMPRESSION: No edema or pneumonia.   Electronically Signed   By: Monte Fantasia M.D.   On: 12/20/2014 10:55   Ct Head Wo Contrast  12/20/2014   CLINICAL DATA:  Weakness and slurred speech since last night.  EXAM: CT HEAD WITHOUT CONTRAST  TECHNIQUE: Contiguous axial images were obtained from the base of the skull through the vertex without intravenous contrast.  COMPARISON:  10/25/2014  FINDINGS: Skull and Sinuses:Negative for fracture or destructive process. No sinus effusion.  Orbits: No acute abnormality.  Brain: No evidence of acute infarction, hemorrhage, hydrocephalus, or mass lesion/mass effect.  There is moderate generalized brain atrophy which is similar prior, with chronic small vessel ischemic gliosis around the lateral ventricles. Re- demonstrated remote lacunar infarct in the left caudate nucleus. There is a small, remote left parietal cortex infarct.  IMPRESSION: 1. No evidence of acute intracranial disease. 2. Senescent and remote ischemic changes are noted above.   Electronically Signed   By: Monte Fantasia M.D.   On: 12/20/2014 13:30   Dg Abd 2 Views  12/02/2014   CLINICAL DATA:  Abdomen pain and distention for 3 weeks, worsened today.  EXAM: ABDOMEN -  2 VIEW  COMPARISON:  10/29/2014  FINDINGS: There is no free intraperitoneal air. There is a large volume of stool throughout the colon to the rectum, but the large rectal stool mass observed on 10/29/2014 is no longer present. There is a prominent soft tissue contour superimposed on the pelvis which may represent a distended urinary bladder. There is no radiographic evidence of bowel obstruction.  IMPRESSION: 1. Negative for obstruction or free air. 2. Generous volume of stool throughout the colon 3. Question urinary bladder distention   Electronically Signed   By: Andreas Newport M.D.   On: 12/02/2014 17:56   Dg Abd Portable 1v  12/22/2014   CLINICAL DATA:  Hyponatremia.  Evaluate nasogastric tube.  EXAM: PORTABLE ABDOMEN - 1 VIEW  COMPARISON:  12/02/2014  FINDINGS: The nasogastric tube tip reaches the stomach  but the side port is above the EG junction. This should be advanced for optimal placement.  IMPRESSION: Nasogastric tube should be advanced for optimal placement. The side port is above the EG junction.   Electronically Signed   By: Andreas Newport M.D.   On: 12/22/2014 23:54    Microbiology: Recent Results (from the past 240 hour(s))  Urine culture     Status: None   Collection Time: 12/20/14 11:27 AM  Result Value Ref Range Status   Specimen Description URINE, CLEAN CATCH  Final   Special Requests NONE  Final   Colony Count   Final    >=100,000 COLONIES/ML Performed at [facility]    Culture   Final    Multiple bacterial morphotypes present, none predominant. Suggest appropriate recollection if clinically indicated. Performed at [facility]    Report Status 12/22/2014 FINAL  Final  Culture, blood (routine x 2)     Status: None   Collection Time: 12/20/14  4:48 PM  Result Value Ref Range Status   Specimen Description BLOOD RIGHT ARM  Final   Special Requests BOTTLES DRAWN AEROBIC AND ANAEROBIC 5CC EACH  Final   Culture   Final    KLEBSIELLA PNEUMONIAE Note: Gram Stain Report Called to,Read Back By and Verified With: SCOTT G RN 12/21/14 AT 72 BY [facility] Performed at [facility]    Report Status 12/23/2014 FINAL  Final   Organism ID, Bacteria KLEBSIELLA PNEUMONIAE  Final      Susceptibility   Klebsiella pneumoniae - MIC*    AMPICILLIN >=32 RESISTANT Resistant     AMPICILLIN/SULBACTAM 4 SENSITIVE Sensitive     CEFAZOLIN <=4 SENSITIVE Sensitive     CEFEPIME <=1 SENSITIVE Sensitive     CEFTAZIDIME <=1 SENSITIVE Sensitive     CEFTRIAXONE <=1 SENSITIVE Sensitive     CIPROFLOXACIN <=0.25 SENSITIVE Sensitive     GENTAMICIN <=1 SENSITIVE Sensitive     IMIPENEM <=0.25 SENSITIVE Sensitive     PIP/TAZO <=4 SENSITIVE Sensitive     TOBRAMYCIN <=1 SENSITIVE Sensitive     TRIMETH/SULFA <=20 SENSITIVE Sensitive     * KLEBSIELLA PNEUMONIAE  Culture, blood  (routine x 2)     Status: None (Preliminary result)   Collection Time: 12/20/14  6:51 PM  Result Value Ref Range Status   Specimen Description BLOOD RIGHT FOREARM  Final   Special Requests BOTTLES DRAWN AEROBIC ONLY 2CC  Final   Culture   Final           BLOOD CULTURE RECEIVED NO GROWTH TO DATE CULTURE WILL BE HELD FOR 5 DAYS BEFORE ISSUING A FINAL NEGATIVE REPORT Note: Culture results may be  compromised due to an inadequate volume of blood received in culture bottles. Performed at [facility]    Report Status PENDING  Incomplete     Labs: Basic Metabolic Panel:  Recent Labs Lab 12/23/14 0635 12/23/14 1130 12/23/14 2234 12/24/14 0720 12/25/14 0556  NA 151* 149* 144 143 144  K 3.7 4.6 3.4* 3.7 3.5  CL 117* 116* 110 110 112  CO2 29 23 23 24 26   GLUCOSE 107* 79 81 83 76  BUN 77* 73* 63* 57* 51*  CREATININE 1.24* 1.27* 1.16* 1.01 0.99  CALCIUM 9.6 9.3 9.7 9.8 9.6   Liver Function Tests:  Recent Labs Lab 12/20/14 1217  AST 27  ALT 27  ALKPHOS 80  BILITOT 0.6  PROT 7.9  ALBUMIN 2.6*   No results for input(s): LIPASE, AMYLASE in the last 168 hours. No results for input(s): AMMONIA in the last 168 hours. CBC:  Recent Labs Lab 12/20/14 1217 12/21/14 0841 12/22/14 0932 12/24/14 0606  WBC 22.2* 22.9* 16.9* 10.4  NEUTROABS 19.6*  --   --   --   HGB 9.5* 8.6* 9.5* 7.9*  HCT 31.3* 28.9* 30.5* 24.8*  MCV 88.4 91.2 89.2 85.8  PLT 378 370 332 366   Cardiac Enzymes: No results for input(s): CKTOTAL, CKMB, CKMBINDEX, TROPONINI in the last 168 hours. BNP: BNP (last 3 results) No results for input(s): BNP in the last 8760 hours.  ProBNP (last 3 results) No results for input(s): PROBNP in the last 8760 hours.  CBG: No results for input(s): GLUCAP in the last 168 hours.     SignedHosie Poisson  [facility] 12/25/2014, 1:57 PM

## 2014-12-25 NOTE — Progress Notes (Signed)
ANTIBIOTIC CONSULT NOTE - INITIAL  Pharmacy Consult:  Cipro Indication:  Klebsiella bacteremia  No Known Allergies  Patient Measurements: Weight: 82 lb 0.2 oz (37.2 kg)  Vital Signs: Temp: 97.9 F (36.6 C) (02/22 0540) Temp Source: Oral (02/22 0540) BP: 118/65 mmHg (02/22 0540) Intake/Output from previous day: 02/21 0701 - 02/22 0700 In: 1413.8 [P.O.:440; I.V.:973.8] Out: -  Intake/Output from this shift: Total I/O In: 120 [P.O.:120] Out: -   Labs:  Recent Labs  12/23/14 2234 12/24/14 0606 12/24/14 0720 12/25/14 0556  WBC  --  10.4  --   --   HGB  --  7.9*  --   --   PLT  --  366  --   --   CREATININE 1.16*  --  1.01 0.99   Estimated Creatinine Clearance: 21.7 mL/min (by C-G formula based on Cr of 0.99). No results for input(s): VANCOTROUGH, VANCOPEAK, VANCORANDOM, GENTTROUGH, GENTPEAK, GENTRANDOM, TOBRATROUGH, TOBRAPEAK, TOBRARND, AMIKACINPEAK, AMIKACINTROU, AMIKACIN in the last 72 hours.   Microbiology: Recent Results (from the past 720 hour(s))  Urine culture     Status: None   Collection Time: 12/20/14 11:27 AM  Result Value Ref Range Status   Specimen Description URINE, CLEAN CATCH  Final   Special Requests NONE  Final   Colony Count   Final    >=100,000 COLONIES/ML Performed at [facility]    Culture   Final    Multiple bacterial morphotypes present, none predominant. Suggest appropriate recollection if clinically indicated. Performed at [facility]    Report Status 12/22/2014 FINAL  Final  Culture, blood (routine x 2)     Status: None   Collection Time: 12/20/14  4:48 PM  Result Value Ref Range Status   Specimen Description BLOOD RIGHT ARM  Final   Special Requests BOTTLES DRAWN AEROBIC AND ANAEROBIC 5CC EACH  Final   Culture   Final    KLEBSIELLA PNEUMONIAE Note: Gram Stain Report Called to,Read Back By and Verified With: SCOTT G RN 12/21/14 AT 52 BY [facility] Performed at [facility]    Report Status 12/23/2014 FINAL   Final   Organism ID, Bacteria KLEBSIELLA PNEUMONIAE  Final      Susceptibility   Klebsiella pneumoniae - MIC*    AMPICILLIN >=32 RESISTANT Resistant     AMPICILLIN/SULBACTAM 4 SENSITIVE Sensitive     CEFAZOLIN <=4 SENSITIVE Sensitive     CEFEPIME <=1 SENSITIVE Sensitive     CEFTAZIDIME <=1 SENSITIVE Sensitive     CEFTRIAXONE <=1 SENSITIVE Sensitive     CIPROFLOXACIN <=0.25 SENSITIVE Sensitive     GENTAMICIN <=1 SENSITIVE Sensitive     IMIPENEM <=0.25 SENSITIVE Sensitive     PIP/TAZO <=4 SENSITIVE Sensitive     TOBRAMYCIN <=1 SENSITIVE Sensitive     TRIMETH/SULFA <=20 SENSITIVE Sensitive     * KLEBSIELLA PNEUMONIAE  Culture, blood (routine x 2)     Status: None (Preliminary result)   Collection Time: 12/20/14  6:51 PM  Result Value Ref Range Status   Specimen Description BLOOD RIGHT FOREARM  Final   Special Requests BOTTLES DRAWN AEROBIC ONLY 2CC  Final   Culture   Final           BLOOD CULTURE RECEIVED NO GROWTH TO DATE CULTURE WILL BE HELD FOR 5 DAYS BEFORE ISSUING A FINAL NEGATIVE REPORT Note: Culture results may be compromised due to an inadequate volume of blood received in culture bottles. Performed at [facility] PENDING  Incomplete    Medical History: Past Medical History  Diagnosis Date  . Hypertension   . Constipation   . Throat cancer 1960s  . Arthritis     "knees" (12/20/2014)  . Depression       Assessment: 58 YOF with Klebsiella bacteremia to continue on PO Cipro.  Patient's renal function is improving; however, her calculated CrCL remains < 30 ml/min.  CTX 2/17 x1 dose Fluc 2/18 >> 2/22 Zosyn 2/18 >> 2/20 Cipro 2/20 >>  2/17 BCx x2 - Kleb pneumo (1 of 2, pan sensitive except amp) 2/17 UCx - mult bacteria   Goal of Therapy:  Resolution of infection   Plan:  - Change Cipro to 500mg  PO Q24H - Monitor renal fxn, clinical progress - F/U updated height and weight.  Pharmacy will sign off and follow adjust for renal  function should it be necessary.  Thanks!    Mikisha Roseland D. Mina Marble, PharmD, BCPS Pager:  [phone] 12/25/2014, 11:27 AM

## 2014-12-25 NOTE — Progress Notes (Addendum)
Went to pt's room for hourly rounding. Pt's NG tube was found lying on the bed. Pt denied of pulling  out of NGT. On-call provider Lamar Blinks, NP made aware.

## 2014-12-25 NOTE — Clinical Social Work Psychosocial (Signed)
Clinical Social Work Department BRIEF PSYCHOSOCIAL ASSESSMENT 12/25/2014  Patient:  Jacqueline Huber, Jacqueline Huber     Account Number:  [id number]     Admit date:  12/20/2014  Clinical Social Worker:  Lovey Newcomer  Date/Time:  12/25/2014 07:19 AM  Referred by:  Physician  Date Referred:  12/25/2014 Referred for  SNF Placement   Other Referral:   NA   Interview type:  Family Other interview type:   Patient's daughter interviewed at patient's bedside.    PSYCHOSOCIAL DATA Living Status:  FAMILY Admitted from facility:   Level of care:   Primary support name:  [name] Primary support relationship to patient:  CHILD, ADULT Degree of support available:   Support is strong.    CURRENT CONCERNS Current Concerns  Post-Acute Placement   Other Concerns:   NA    SOCIAL WORK ASSESSMENT / PLAN CSW met with patient and daughter at bedside to complete assessment. Patient appears tired and was not engaged in assessment. Patient's daughter [name] states that the patient was admitted from home and the family has been weighing the different options (home with hospice vs SNF with palliative). CSW explained SNF search/placement process and answered daughter's questions. Daughter states that she has a preference for [facility] as the family has previous experiences with the facility.    CSW inquired about how the family is coping with the patient's current medical situation. Daughter [name] states that the family has been praying very hard in hopes that she will get well enough to eventually return home. Daughter appears optimistic that the patient will improve. Daughter does appear to have good insight into patient's condition as she shows understanding that the patient's improvement may be limited. CSW will follow up with any available bed offers.   Assessment/plan status:  Psychosocial Support/Ongoing Assessment of Needs Other assessment/ plan:   Complete Fl2, Fax, PASRR    Information/referral to community resources:   CSW contact information and SNF list given.    PATIENT'S/FAMILY'S RESPONSE TO PLAN OF CARE: Patient's family is agreeable to SNF placement with palliative services. Family requests [place]. Overall daughter was engaged in assessment and shows that she is a great support of patient.       Liz Beach MSW, [place], [address], [phone]

## 2014-12-25 NOTE — Clinical Social Work Placement (Signed)
Clinical Social Work Department CLINICAL SOCIAL WORK PLACEMENT NOTE 12/25/2014  Patient: Jacqueline Huber, Jacqueline Huber Account Number: [id number] Admit date: 12/20/2014  Clinical Social Worker: Lovey Newcomer Date/time: 12/25/2014 07:27 AM  Clinical Social Work is seeking post-discharge placement for this patient at the following level of care: SKILLED NURSING (*CSW will update this form in Epic as items are completed)   12/25/2014 Patient/family provided with [place] Department of Clinical Social Work's list of facilities offering this level of care within the geographic area requested by the patient (or if unable, by the patient's family).  12/25/2014 Patient/family informed of their freedom to choose among providers that offer the needed level of care, that participate in Medicare, Medicaid or managed care program needed by the patient, have an available bed and are willing to accept the patient.  12/25/2014 Patient/family informed of [facility]' ownership interest in [facility], as well as of the fact that they are under no obligation to receive care at this facility.  PASARR submitted to EDS on 12/25/2014 PASARR number received on 12/25/2014  FL2 transmitted to all facilities in geographic area requested by pt/family on 12/25/2014 FL2 transmitted to all facilities within larger geographic area on   Patient informed that his/her managed care company has contracts with or will negotiate with certain facilities, including the following:    Patient/family informed of bed offers received: 12/25/14 Patient chooses bed at [facility] Physician recommends and patient chooses bed at   Patient to be transferred to [facility] on 12/25/14 Patient to be transferred to facility by Ambulance Patient and family notified of transfer on 12/25/14 Name of family member notified:   The following physician request were entered in Epic:   Additional  Comments:  Per MD patient ready for DC to [facility] SNF with Palliative services. RN, patient, patient's family, and facility notified of DC. RN given number for report. DC packet on chart. [facility] transport requested for patient. CSW signing off.    Liz Beach MSW, [place], [place], [phone]

## 2014-12-25 NOTE — Clinical Social Work Placement (Signed)
Clinical Social Work Department CLINICAL SOCIAL WORK PLACEMENT NOTE 12/25/2014  Patient:  TEMIA, DEBROUX  Account Number:  [id number] Admit date:  12/20/2014  Clinical Social Worker:  Lovey Newcomer  Date/time:  12/25/2014 07:27 AM  Clinical Social Work is seeking post-discharge placement for this patient at the following level of care:   SKILLED NURSING   (*CSW will update this form in Epic as items are completed)   12/25/2014  Patient/family provided with [place] Department of Clinical Social Work's list of facilities offering this level of care within the geographic area requested by the patient (or if unable, by the patient's family).  12/25/2014  Patient/family informed of their freedom to choose among providers that offer the needed level of care, that participate in Medicare, Medicaid or managed care program needed by the patient, have an available bed and are willing to accept the patient.  12/25/2014  Patient/family informed of [facility]' ownership interest in [facility], as well as of the fact that they are under no obligation to receive care at this facility.  PASARR submitted to EDS on 12/25/2014 PASARR number received on 12/25/2014  FL2 transmitted to all facilities in geographic area requested by pt/family on  12/25/2014 FL2 transmitted to all facilities within larger geographic area on   Patient informed that his/her managed care company has contracts with or will negotiate with  certain facilities, including the following:     Patient/family informed of bed offers received:   Patient chooses bed at  Physician recommends and patient chooses bed at    Patient to be transferred to  on   Patient to be transferred to facility by  Patient and family notified of transfer on  Name of family member notified:    The following physician request were entered in Epic:   Additional Comments:    Liz Beach MSW, [place], [place],  [phone]

## 2014-12-25 NOTE — Progress Notes (Signed)
Patient VO:[id number] B Abplanalp      DOB: Jul 14, 1921      GHW:[id number]   Palliative Medicine Team at [facility] Progress Note    Subjective: No complaints this morning. Moved bowels several times overnight. Reports sleeping well. Daughter noted some mild confusion this morning.     Filed Vitals:   12/25/14 0540  BP: 118/65  Pulse:   Temp: 97.9 F (36.6 C)  Resp: 16   Physical exam: GEN: alert, NAD, cachexia HEENT: [place], sclera anicteric  CBC    Component Value Date/Time   WBC 10.4 12/24/2014 0606   RBC 2.89* 12/24/2014 0606   RBC 2.80* 03/03/2014 0402   HGB 7.9* 12/24/2014 0606   HCT 24.8* 12/24/2014 0606   PLT 366 12/24/2014 0606   MCV 85.8 12/24/2014 0606   MCH 27.3 12/24/2014 0606   MCHC 31.9 12/24/2014 0606   RDW 14.5 12/24/2014 0606   LYMPHSABS 1.1 12/20/2014 1217   MONOABS 1.4* 12/20/2014 1217   EOSABS 0.0 12/20/2014 1217   BASOSABS 0.0 12/20/2014 1217    CMP     Component Value Date/Time   NA 144 12/25/2014 0556   K 3.5 12/25/2014 0556   CL 112 12/25/2014 0556   CO2 26 12/25/2014 0556   GLUCOSE 76 12/25/2014 0556   BUN 51* 12/25/2014 0556   CREATININE 0.99 12/25/2014 0556   CALCIUM 9.6 12/25/2014 0556   PROT 7.9 12/20/2014 1217   ALBUMIN 2.6* 12/20/2014 1217   AST 27 12/20/2014 1217   ALT 27 12/20/2014 1217   ALKPHOS 80 12/20/2014 1217   BILITOT 0.6 12/20/2014 1217   GFRNONAA 48* 12/25/2014 0556   GFRAA 56* 12/25/2014 0556   Assessment and plan: 81 yo female with dementia, chronic constipation, FTT who presented with functional decline, dehydration, decreased PO intake.   1. Code Status: Full I think this will need addressed down the road. Too much anxiety around subject this hospital stay.    2. GOC: See initial consult. Family hopeful of going to [facility].  Janika has been there before.  Again reinforced that going there should be with trial of improving her functionality and if we are not meeting that goal, need to re-consider larger goals  of care for her.  I was asked by [name] if I thought she could get close to where she was before.  I don't think so.  I suspect that her FTT is more related to age, comorbidities, progressive weight loss/FTT which will not be easily reversible. At same time, I am not opposed to SNF trial to see what she can gain back.  I think this will need ongoing discussion through outpatient palliative and or hospice care.    Family is open to palliative consultation at SNF.     3. Symptom Management:  1. Loss of Appetite/FTT: cont mirtazapine   2. Constipation: resolved. I agree with liquid doc/senna prescription. Hopefully these are low volume enouugh that she consistently will take.  3. ?Depression- mirtazapine started 2/21  4. Psychosocial/Spiritual: 1 living child (daughter [name]) whom she lives with. Formerly worked as a Health visitor D.O. Palliative Medicine Team at [facility]  Pager: [phone] Team Phone: [phone]

## 2014-12-25 NOTE — Progress Notes (Signed)
Speech Language Pathology Treatment: Dysphagia  Patient Details Name: Jacqueline Huber MRN: [id number] DOB: 1921/04/08 Today's Date: 12/25/2014 Time: 7681-1572 SLP Time Calculation (min) (ACUTE ONLY): 20 min  Assessment / Plan / Recommendation Clinical Impression  SLP administered trials of advanced Dys 2 textures to assess possibility for diet advancement as patient has limited oral intake. Pt required up to four minutes and a liquid wash to clear small, single bite of Dys 2 trial from oral cavity. Concern would be that the amount of energy required for oral manipulation would exceed the amount she is replenishing. At this time patient's oropharyngeal function is most optimal with liquid consistencies, which pt seems to prefer as well. Would continue Dys 1 diet and thin liquids with consideration for nutritional supplements in liquid form.   HPI HPI: Jacqueline Huber is a 81 y.o. female with a past medical history of cognitive impairment, hypertension, history of hip fracture, throat cancer in the 60's - surgery, ? radiation tx presenting to the emergency department having a functional decline over the past 2 weeks with progressive weakness   Pertinent Vitals Pain Assessment: No/denies pain  SLP Plan  Continue with current plan of care    Recommendations Diet recommendations: Dysphagia 1 (puree);Thin liquid Liquids provided via: Cup;Straw Medication Administration: Crushed with puree Supervision: Patient able to self feed;Full supervision/cueing for compensatory strategies Compensations: Slow rate;Small sips/bites;Check for pocketing Postural Changes and/or Swallow Maneuvers: Seated upright 90 degrees;Upright 30-60 min after meal       Oral Care Recommendations: Oral care BID Follow up Recommendations: Skilled Nursing facility Plan: Continue with current plan of care       Germain Osgood, M.A. CCC-SLP [phone]  Germain Osgood 12/25/2014, 11:28 AM

## 2014-12-25 NOTE — Progress Notes (Addendum)
Report called to snf facility, [facility] to francis rn,, all questions answered, pt stable, no complaints or concerns stated

## 2014-12-26 LAB — FERRITIN: Ferritin: 849 ng/mL — ABNORMAL HIGH (ref 10–291)

## 2014-12-26 LAB — VITAMIN B12: Vitamin B-12: 1506 pg/mL — ABNORMAL HIGH (ref 211–911)

## 2014-12-26 LAB — IRON AND TIBC
Iron: 32 ug/dL — ABNORMAL LOW (ref 42–145)
Saturation Ratios: 18 % — ABNORMAL LOW (ref 20–55)
TIBC: 180 ug/dL — ABNORMAL LOW (ref 250–470)
UIBC: 148 ug/dL (ref 125–400)

## 2014-12-26 LAB — FOLATE: Folate: 6 ng/mL

## 2014-12-27 LAB — CULTURE, BLOOD (ROUTINE X 2): Culture: NO GROWTH

## 2015-03-04 DEATH — deceased

## 2016-09-03 IMAGING — CR DG CHEST 1V PORT
1 series · 1 of 1 positions shown · non-contrast
Comparison: Chest radiograph performed 10/25/2014

CLINICAL DATA: Acute onset of supraventricular tachycardia.
Diarrhea. Initial encounter.

EXAM:
PORTABLE CHEST - 1 VIEW

[AP]
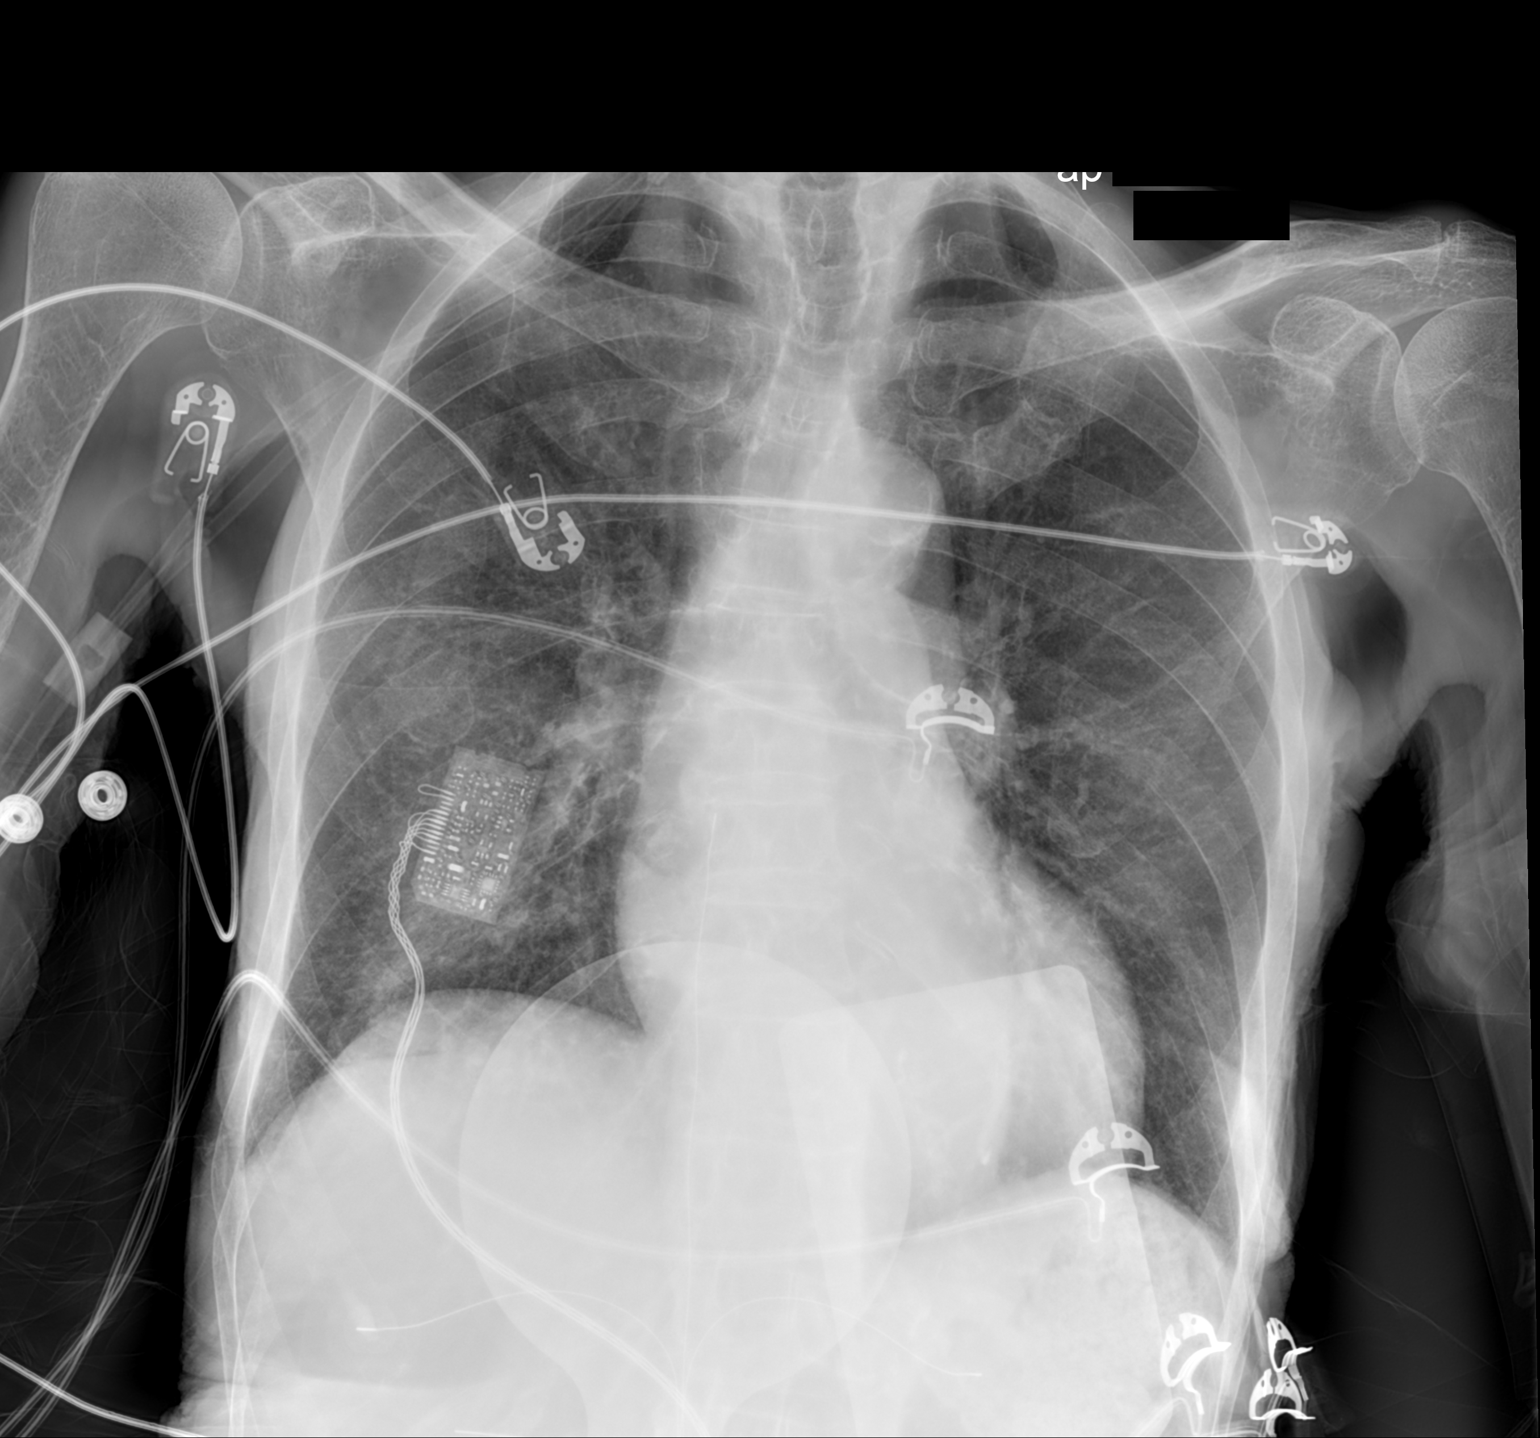

[1 of 1 positions shown; findings below may reference images not displayed]

FINDINGS: External pacing pads are noted, with a metallic device overlying the
right hemithorax.

The lungs are well expanded. Mild vascular congestion is noted.
Mildly increased interstitial markings may reflect mild interstitial
edema. No pleural effusion or pneumothorax is seen.

The cardiomediastinal silhouette is normal in size. No acute osseous
abnormalities are identified.
IMPRESSION: Mild vascular congestion noted. Mildly increased interstitial
markings may reflect mild interstitial edema.

## 2016-10-25 IMAGING — CT CT HEAD W/O CM
4 series · 22 of 30 positions shown, 23 images · non-contrast
Comparison: 10/25/2014

CLINICAL DATA: Weakness and slurred speech since last night.

EXAM:
CT HEAD WITHOUT CONTRAST
TECHNIQUE: Contiguous axial images were obtained from the base of the skull
through the vertex without intravenous contrast.

[Series 201: head w/o, idose (1) · axial · non-contrast · 0.49mm/px · z∈[+145,+215]mm · 3 of 30 slices shown, 4 images (1 of 2)]
[im 8/30  brain]
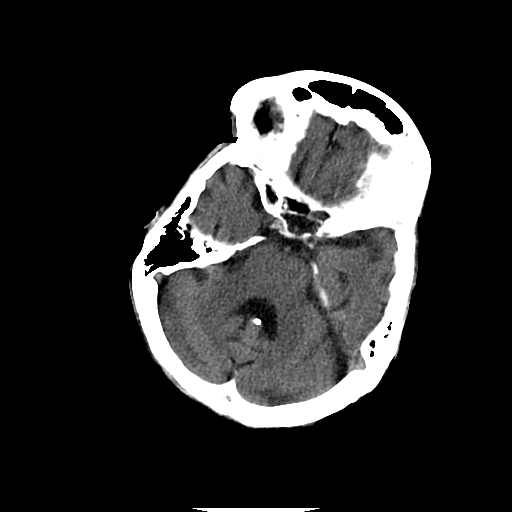
[im 8/30  bone]
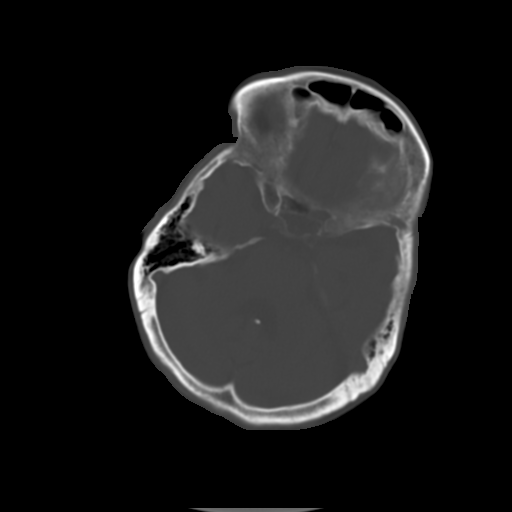
[im 15/30  brain]
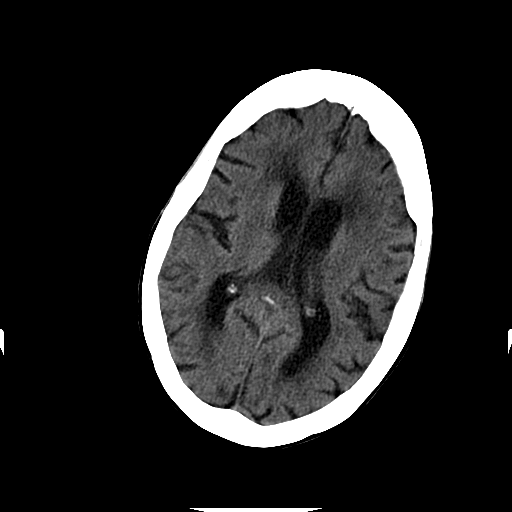
[im 22/30  brain]
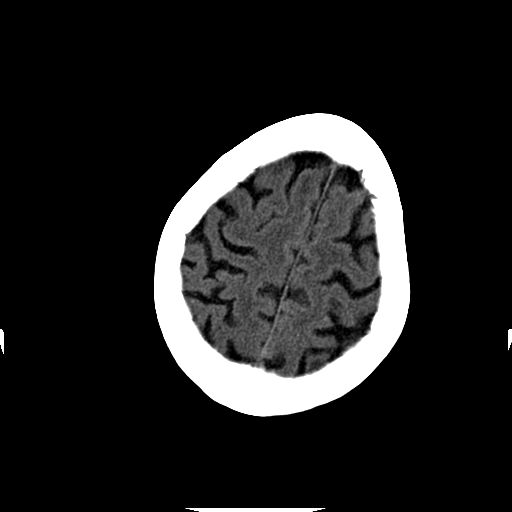

[Series 202: head w/o bone, idose (1) · axial · non-contrast · 0.49mm/px · z∈[+124,+239]mm · 8 of 60 slices shown (1 of 2)]
[im 7/60  bone]
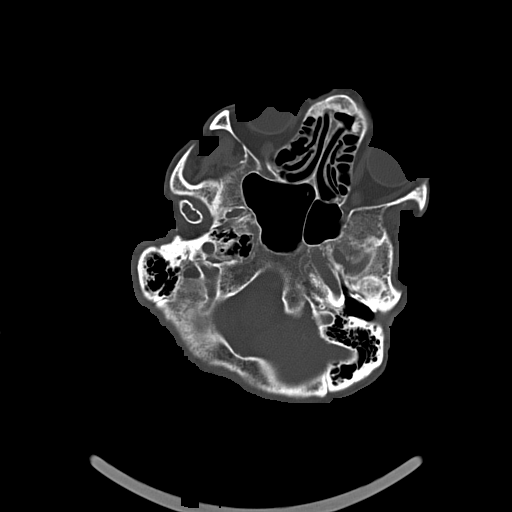
[im 14/60  bone]
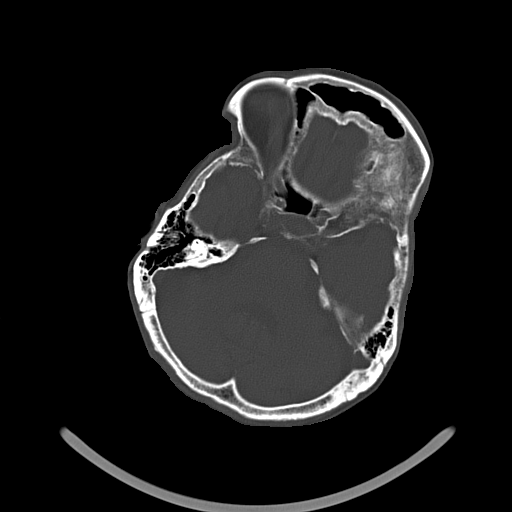
[im 20/60  bone]
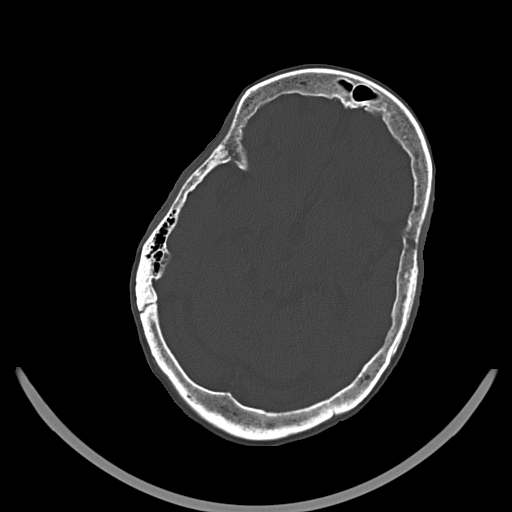
[im 27/60  bone]
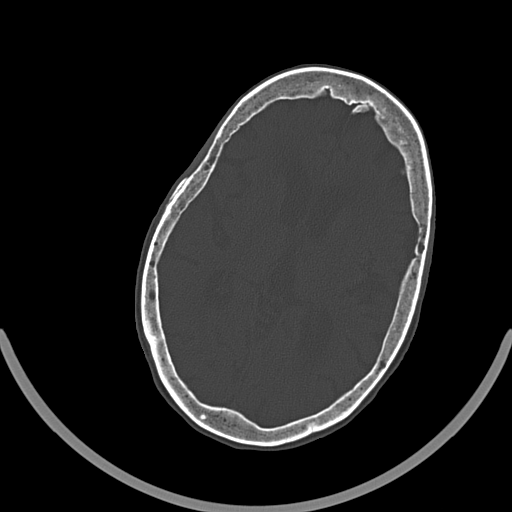
[im 33/60  bone]
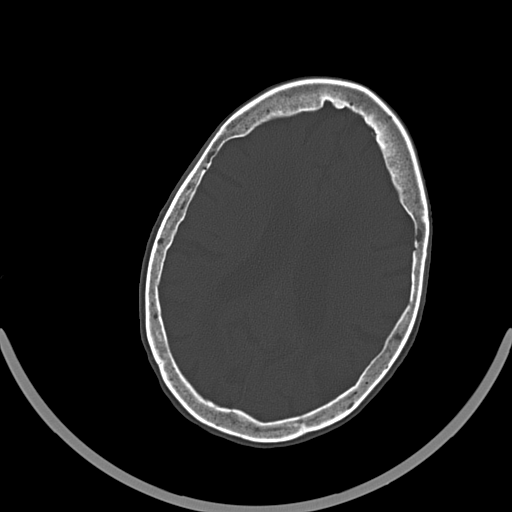
[im 40/60  bone]
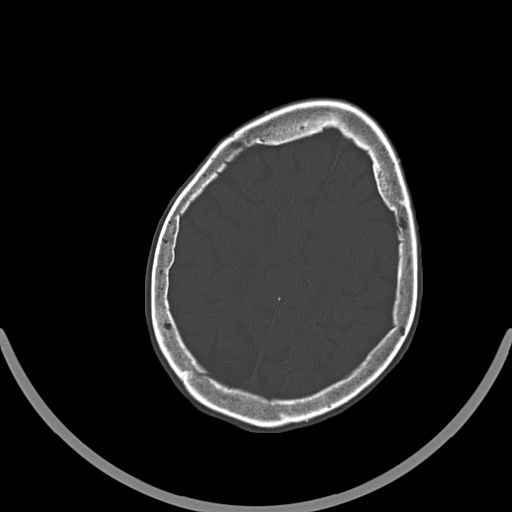
[im 46/60  bone]
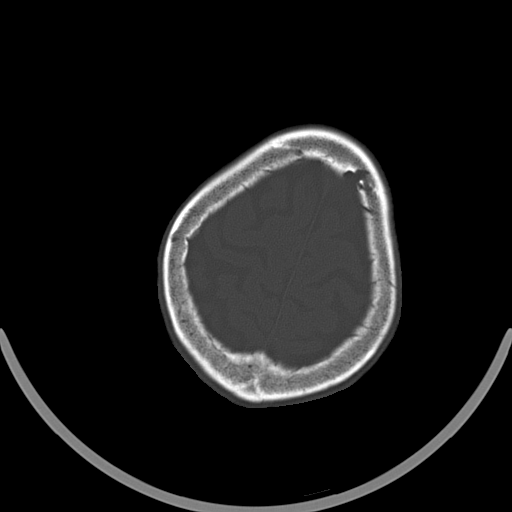
[im 53/60  bone]
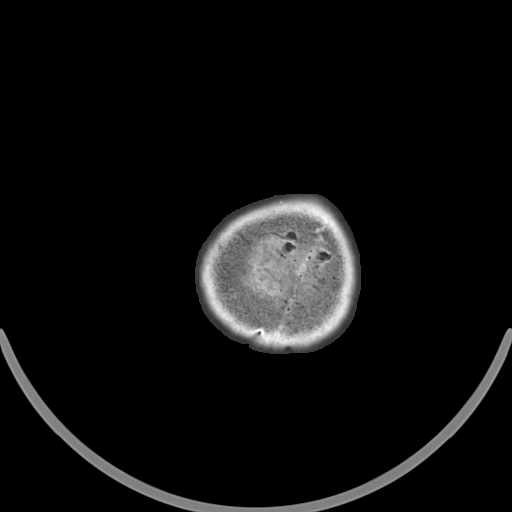

[Series 203: head w/o, idose (1) · axial · non-contrast · 0.64mm/px · z∈[+140,+210]mm · 3 of 28 slices shown (2 of 2)]
[im 7/28  brain]
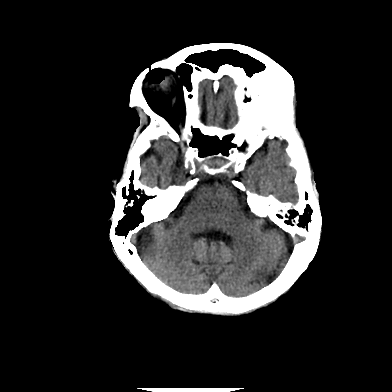
[im 14/28  brain]
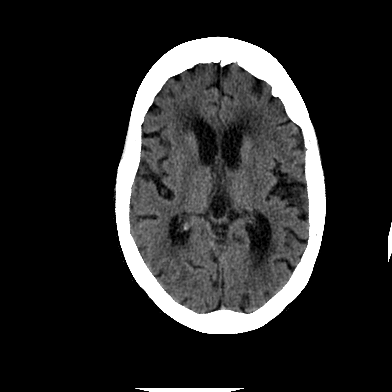
[im 21/28  brain]
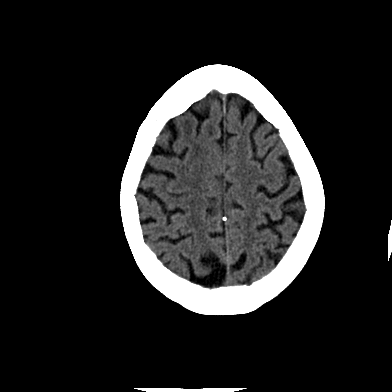

[Series 204: head w/o bone, idose (1) · axial · non-contrast · 0.63mm/px · z∈[+124,+234]mm · 8 of 58 slices shown (2 of 2)]
[im 7/58  bone]
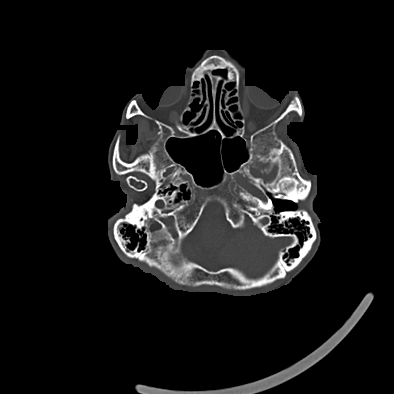
[im 13/58  bone]
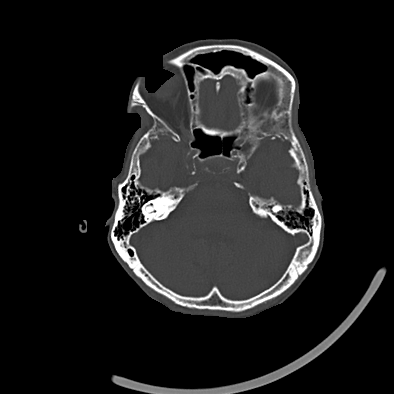
[im 20/58  bone]
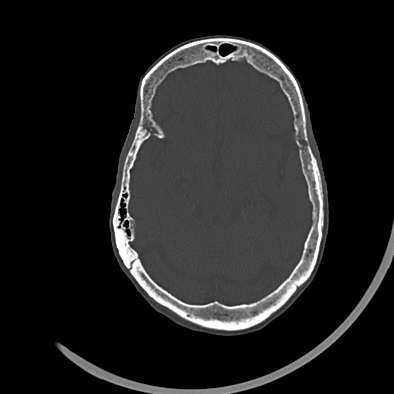
[im 26/58  bone]
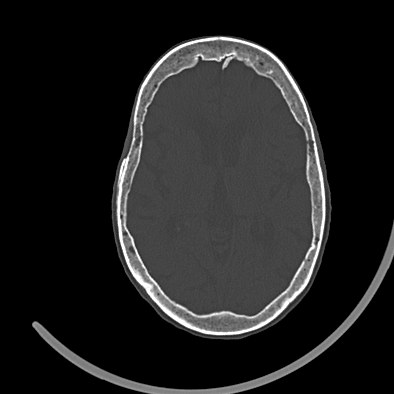
[im 32/58  bone]
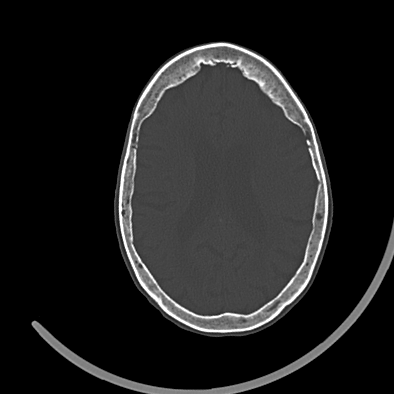
[im 39/58  bone]
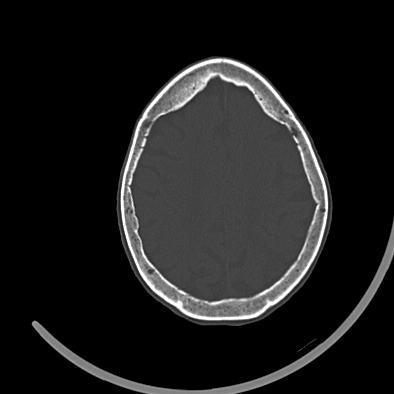
[im 45/58  bone]
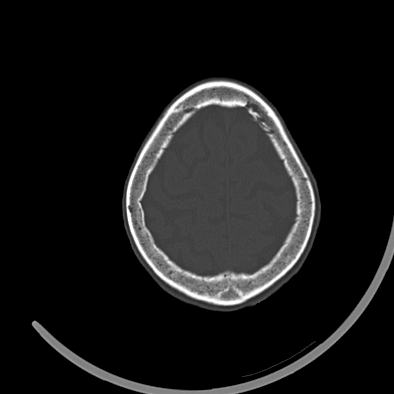
[im 51/58  bone]
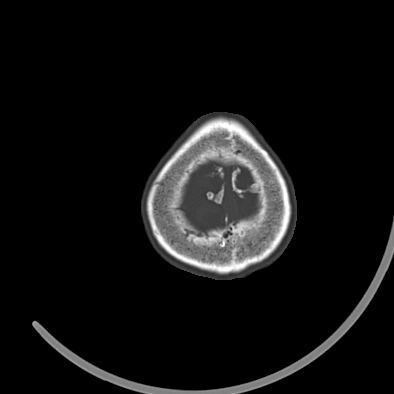

[22 of 30 positions shown; findings below may reference images not displayed]

FINDINGS: Skull and Sinuses:Negative for fracture or destructive process. No
sinus effusion.

Orbits: No acute abnormality.

Brain: No evidence of acute infarction, hemorrhage, hydrocephalus,
or mass lesion/mass effect.

There is moderate generalized brain atrophy which is similar prior,
with chronic small vessel ischemic gliosis around the lateral
ventricles. Re- demonstrated remote lacunar infarct in the left
caudate nucleus. There is a small, remote left parietal cortex
infarct.
IMPRESSION: 1. No evidence of acute intracranial disease.
2. Senescent and remote ischemic changes are noted above.
